# Patient Record
Sex: Male | Born: 1990
Health system: Southern US, Community
[De-identification: ages and names within clinical notes are randomized; demographics above are authoritative.]

## PROBLEM LIST (undated history)

## (undated) DIAGNOSIS — G4730 Sleep apnea, unspecified: Secondary | ICD-10-CM

## (undated) DIAGNOSIS — E119 Type 2 diabetes mellitus without complications: Secondary | ICD-10-CM

## (undated) DIAGNOSIS — F209 Schizophrenia, unspecified: Secondary | ICD-10-CM

## (undated) DIAGNOSIS — J45909 Unspecified asthma, uncomplicated: Secondary | ICD-10-CM

## (undated) HISTORY — DX: Unspecified asthma, uncomplicated: J45.909

---

## 2018-07-06 DIAGNOSIS — M549 Dorsalgia, unspecified: Secondary | ICD-10-CM | POA: Diagnosis not present

## 2018-07-06 DIAGNOSIS — Y92412 Parkway as the place of occurrence of the external cause: Secondary | ICD-10-CM | POA: Diagnosis not present

## 2018-07-06 DIAGNOSIS — S29012A Strain of muscle and tendon of back wall of thorax, initial encounter: Secondary | ICD-10-CM | POA: Diagnosis not present

## 2018-07-06 DIAGNOSIS — M546 Pain in thoracic spine: Secondary | ICD-10-CM | POA: Diagnosis not present

## 2018-07-06 DIAGNOSIS — Y999 Unspecified external cause status: Secondary | ICD-10-CM | POA: Diagnosis not present

## 2018-07-06 DIAGNOSIS — F209 Schizophrenia, unspecified: Secondary | ICD-10-CM | POA: Diagnosis not present

## 2018-12-31 DIAGNOSIS — G510 Bell's palsy: Secondary | ICD-10-CM | POA: Diagnosis not present

## 2019-02-02 DIAGNOSIS — J45909 Unspecified asthma, uncomplicated: Secondary | ICD-10-CM | POA: Diagnosis not present

## 2019-02-02 DIAGNOSIS — Z9114 Patient's other noncompliance with medication regimen: Secondary | ICD-10-CM | POA: Diagnosis not present

## 2019-02-02 DIAGNOSIS — Z20828 Contact with and (suspected) exposure to other viral communicable diseases: Secondary | ICD-10-CM | POA: Diagnosis not present

## 2019-02-02 DIAGNOSIS — F202 Catatonic schizophrenia: Secondary | ICD-10-CM | POA: Diagnosis not present

## 2019-02-10 DIAGNOSIS — F202 Catatonic schizophrenia: Secondary | ICD-10-CM | POA: Diagnosis not present

## 2019-02-28 DIAGNOSIS — Y906 Blood alcohol level of 120-199 mg/100 ml: Secondary | ICD-10-CM | POA: Diagnosis not present

## 2019-02-28 DIAGNOSIS — F1010 Alcohol abuse, uncomplicated: Secondary | ICD-10-CM | POA: Diagnosis not present

## 2019-02-28 DIAGNOSIS — F200 Paranoid schizophrenia: Secondary | ICD-10-CM | POA: Diagnosis not present

## 2019-02-28 DIAGNOSIS — F10120 Alcohol abuse with intoxication, uncomplicated: Secondary | ICD-10-CM | POA: Diagnosis not present

## 2019-02-28 DIAGNOSIS — Z79899 Other long term (current) drug therapy: Secondary | ICD-10-CM | POA: Diagnosis not present

## 2019-04-09 DIAGNOSIS — F209 Schizophrenia, unspecified: Secondary | ICD-10-CM | POA: Diagnosis not present

## 2019-06-10 DIAGNOSIS — F209 Schizophrenia, unspecified: Secondary | ICD-10-CM | POA: Diagnosis not present

## 2019-12-13 DIAGNOSIS — Z87891 Personal history of nicotine dependence: Secondary | ICD-10-CM | POA: Diagnosis not present

## 2019-12-13 DIAGNOSIS — Z20822 Contact with and (suspected) exposure to covid-19: Secondary | ICD-10-CM | POA: Diagnosis not present

## 2019-12-13 DIAGNOSIS — Z008 Encounter for other general examination: Secondary | ICD-10-CM | POA: Diagnosis not present

## 2019-12-13 DIAGNOSIS — F202 Catatonic schizophrenia: Secondary | ICD-10-CM | POA: Diagnosis not present

## 2019-12-13 DIAGNOSIS — Z720 Tobacco use: Secondary | ICD-10-CM | POA: Diagnosis not present

## 2019-12-13 DIAGNOSIS — F209 Schizophrenia, unspecified: Secondary | ICD-10-CM | POA: Diagnosis not present

## 2020-02-03 DIAGNOSIS — R45851 Suicidal ideations: Secondary | ICD-10-CM | POA: Diagnosis not present

## 2020-02-03 DIAGNOSIS — J45909 Unspecified asthma, uncomplicated: Secondary | ICD-10-CM | POA: Diagnosis not present

## 2020-02-03 DIAGNOSIS — F202 Catatonic schizophrenia: Secondary | ICD-10-CM | POA: Diagnosis not present

## 2020-02-03 DIAGNOSIS — Z20822 Contact with and (suspected) exposure to covid-19: Secondary | ICD-10-CM | POA: Diagnosis not present

## 2020-02-03 DIAGNOSIS — F329 Major depressive disorder, single episode, unspecified: Secondary | ICD-10-CM | POA: Diagnosis not present

## 2020-02-03 DIAGNOSIS — Z87891 Personal history of nicotine dependence: Secondary | ICD-10-CM | POA: Diagnosis not present

## 2020-02-04 DIAGNOSIS — F332 Major depressive disorder, recurrent severe without psychotic features: Secondary | ICD-10-CM | POA: Diagnosis not present

## 2020-02-04 DIAGNOSIS — R45851 Suicidal ideations: Secondary | ICD-10-CM | POA: Diagnosis not present

## 2020-02-04 DIAGNOSIS — Z20822 Contact with and (suspected) exposure to covid-19: Secondary | ICD-10-CM | POA: Diagnosis not present

## 2020-02-04 DIAGNOSIS — J45909 Unspecified asthma, uncomplicated: Secondary | ICD-10-CM | POA: Diagnosis not present

## 2020-02-04 DIAGNOSIS — F061 Catatonic disorder due to known physiological condition: Secondary | ICD-10-CM | POA: Diagnosis not present

## 2020-02-04 DIAGNOSIS — F329 Major depressive disorder, single episode, unspecified: Secondary | ICD-10-CM | POA: Diagnosis not present

## 2020-02-04 DIAGNOSIS — Z9114 Patient's other noncompliance with medication regimen: Secondary | ICD-10-CM | POA: Diagnosis not present

## 2020-02-04 DIAGNOSIS — F202 Catatonic schizophrenia: Secondary | ICD-10-CM | POA: Diagnosis not present

## 2020-02-04 DIAGNOSIS — F250 Schizoaffective disorder, bipolar type: Secondary | ICD-10-CM | POA: Diagnosis not present

## 2020-02-04 DIAGNOSIS — F209 Schizophrenia, unspecified: Secondary | ICD-10-CM | POA: Diagnosis not present

## 2020-02-04 DIAGNOSIS — Z87891 Personal history of nicotine dependence: Secondary | ICD-10-CM | POA: Diagnosis not present

## 2020-02-18 DIAGNOSIS — F209 Schizophrenia, unspecified: Secondary | ICD-10-CM | POA: Diagnosis not present

## 2020-02-23 DIAGNOSIS — Z20822 Contact with and (suspected) exposure to covid-19: Secondary | ICD-10-CM | POA: Insufficient documentation

## 2020-02-23 DIAGNOSIS — F209 Schizophrenia, unspecified: Secondary | ICD-10-CM | POA: Diagnosis not present

## 2020-02-23 DIAGNOSIS — F320 Major depressive disorder, single episode, mild: Secondary | ICD-10-CM | POA: Insufficient documentation

## 2020-02-23 DIAGNOSIS — Z03818 Encounter for observation for suspected exposure to other biological agents ruled out: Secondary | ICD-10-CM | POA: Diagnosis not present

## 2020-02-24 ENCOUNTER — Emergency Department (HOSPITAL_COMMUNITY)
Admission: EM | Admit: 2020-02-24 | Discharge: 2020-02-24 | Disposition: A | Discharge status: Home / Self Care | Payer: BC Managed Care – PPO | Attending: Emergency Medicine | Admitting: Emergency Medicine

## 2020-02-24 DIAGNOSIS — F209 Schizophrenia, unspecified: Secondary | ICD-10-CM

## 2020-02-24 LAB — SARS CORONAVIRUS 2 BY RT PCR (HOSPITAL ORDER, PERFORMED IN ~~LOC~~ HOSPITAL LAB): SARS Coronavirus 2: NEGATIVE

## 2020-02-24 LAB — COMPREHENSIVE METABOLIC PANEL
ALT: 67 U/L — ABNORMAL HIGH (ref 0–44)
AST: 29 U/L (ref 15–41)
Albumin: 4.8 g/dL (ref 3.5–5.0)
Alkaline Phosphatase: 37 U/L — ABNORMAL LOW (ref 38–126)
Anion gap: 12 (ref 5–15)
BUN: 10 mg/dL (ref 6–20)
CO2: 27 mmol/L (ref 22–32)
Calcium: 9.8 mg/dL (ref 8.9–10.3)
Chloride: 101 mmol/L (ref 98–111)
Creatinine, Ser: 0.95 mg/dL (ref 0.61–1.24)
GFR calc Af Amer: >60 mL/min (ref >60)
GFR calc non Af Amer: >60 mL/min (ref >60)
Glucose, Bld: 105 mg/dL — ABNORMAL HIGH (ref 70–99)
Potassium: 4.1 mmol/L (ref 3.5–5.1)
Sodium: 140 mmol/L (ref 135–145)
Total Bilirubin: 0.6 mg/dL (ref 0.3–1.2)
Total Protein: 8.2 g/dL — ABNORMAL HIGH (ref 6.5–8.1)

## 2020-02-24 LAB — CBC
HCT: 46.1 % (ref 39.0–52.0)
Hemoglobin: 14.9 g/dL (ref 13.0–17.0)
MCH: 27.9 pg (ref 26.0–34.0)
MCHC: 32.3 g/dL (ref 30.0–36.0)
MCV: 86.3 fL (ref 80.0–100.0)
Platelets: 274 10*3/uL (ref 150–400)
RBC: 5.34 MIL/uL (ref 4.22–5.81)
RDW: 14.7 % (ref 11.5–15.5)
WBC: 8 10*3/uL (ref 4.0–10.5)
nRBC: 0 % (ref 0.0–0.2)

## 2020-02-24 LAB — ETHANOL: Alcohol, Ethyl (B): <10 mg/dL (ref <10)

## 2020-02-24 MED ORDER — LORAZEPAM 1 MG PO TABS: 1.0000 mg | ORAL_TABLET | ORAL | Status: DC | PRN

## 2020-02-24 MED ORDER — ZIPRASIDONE MESYLATE 20 MG IM SOLR: 20.0000 mg | INTRAMUSCULAR | Status: DC | PRN

## 2020-02-24 MED ORDER — OLANZAPINE 10 MG PO TBDP: 10.0000 mg | ORAL_TABLET | Freq: Three times a day (TID) | ORAL | Status: DC | PRN

## 2020-02-24 NOTE — ED Notes (Signed)
Provided pt w/labeled specimen cup for urine collection. ENMiles 

## 2020-02-24 NOTE — BH Assessment (Signed)
BHH Assessment Progress Note  Per Nelly Rout, MD, this pt does not require psychiatric hospitalization at this time.  Pt is to be discharged from [facility] with outpatient referrals.  Pt would prefer to have appointments scheduled.  Pt now has appointments scheduled at the [facility] [place].  Pt will see Hilbert Odor, LCSW for intake on 02/24/2020 at 14:30.  She will see Kathryne Sharper, MD for psychiatry on 04/28/2020 at 11:00.  These appointments have been included in pt's discharge instructions.  Pt's nurse, Yvonna Alanis, has been notified.  Doylene Canning, MA Triage Specialist [phone]

## 2020-02-24 NOTE — ED Triage Notes (Signed)
Arrived POV. Patient will not answer any questions during triage. Patient has blank stare, Will try to contact family

## 2020-02-24 NOTE — BH Assessment (Addendum)
Assessment Note  Nathan Wilkerson is an 29 y.o. male presenting voluntarily to [facility] ED via POV. Per EDP: "Patient with history of schizophrenia was apparently dropped off here in the emergency department.  He is completely nonverbal, will not answer any questions.  He appears well otherwise.  He is awake, alert with no focal neurologic deficits or findings.  Suspect behavior is psychiatric, will require behavioral health evaluation."  Upon this counselor's exam patient continues to be nonverbal.  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 Patient has a history of schizophrenia and was recently hospitalized at [facility] for 2 weeks. Patient had same presentation prior to hospitalization. When he came home he was "totally normal." Several days ago he became nonverbal and appeared anxious again. To his knowledge patient is not suicidal or homicidal.   Diagnosis: Schizophrenia  Past Medical History: No past medical history on file.  Family History: No family history on file.  Social History:  has no history on file for tobacco, alcohol, and drug.  Additional Social History:  Alcohol / Drug Use Pain Medications: see MAR Prescriptions: see MAR Over the Counter: see MAR History of alcohol / drug use?: No history of alcohol / drug abuse  CIWA: CIWA-Ar BP: (!) 110/94 Pulse Rate: 80 COWS:    Allergies: Not on File  Home Medications: (Not in a hospital admission)   OB/GYN Status:  No LMP for male patient.  General Assessment Data Location of Assessment: [facility] ED TTS Assessment: In system Is this a Tele or Face-to-Face Assessment?: Face-to-Face Is this an Initial Assessment or a Re-assessment for this encounter?: Initial Assessment Patient Accompanied by:: N/A Language Other than English: No Living Arrangements: (with uncle) What gender do you identify as?:  Male Date Telepsych consult ordered in [facility]: 02/24/20 Time Telepsych consult ordered in [facility]: 0129 Marital status: [place] name: Auzenne Pregnancy Status: No Living Arrangements: Other relatives Can pt return to current living arrangement?: Yes Admission Status: Voluntary Is patient capable of signing voluntary admission?: Yes Referral Source: Self/Family/Friend Insurance type: [place] Living Arrangements: Other relatives Legal Guardian: (self) Name of Psychiatrist: [facility] Name of Therapist: [facility]  Education Status Is patient currently in school?: No Is the patient employed, unemployed or receiving disability?: Employed  Risk to self with the past 6 months Suicidal Ideation: No Has patient been a risk to self within the past 6 months prior to admission? : No Suicidal Intent: No Has patient had any suicidal intent within the past 6 months prior to admission? : No Is patient at risk for suicide?: No Suicidal Plan?: No Has patient had any suicidal plan within the past 6 months prior to admission? : No Access to Means: No What has been your use of drugs/alcohol within the last 12 months?: UTA Previous Attempts/Gestures: (UTA) How many times?: (UTA) Other Self Harm Risks: none noted Triggers for Past Attempts: Unknown Intentional Self Injurious Behavior: (UTA) Family Suicide History: Unable to assess Recent stressful life event(s): (UTA) Persecutory voices/beliefs?: No Depression: (UTA) Depression Symptoms: (UTA) Substance abuse history and/or treatment for substance abuse?: No Suicide prevention information given to non-admitted patients: Not applicable  Risk to Others within the past 6 months Homicidal Ideation: No Does patient have any lifetime risk of violence toward others beyond the six months prior to admission? : No Thoughts of Harm to Others: No Current Homicidal Intent:  No Current Homicidal Plan: No Access to Homicidal Means:  No Identified Victim: UTA History of harm to others?: No Assessment of Violence: None Noted Violent Behavior Description: none noted Does patient have access to weapons?: No Criminal Charges Pending?: No Does patient have a court date: No Is patient on probation?: No  Psychosis Hallucinations: (UTA) Delusions: Persecutory  Mental Status Report Appearance/Hygiene: In scrubs Eye Contact: Poor Motor Activity: Freedom of movement Speech: Aphasic Level of Consciousness: Quiet/awake Mood: (UTA) Affect: Flat Anxiety Level: None Thought Processes: Unable to Assess Judgement: Unable to Assess Orientation: Unable to assess Obsessive Compulsive Thoughts/Behaviors: Unable to Assess  Cognitive Functioning Concentration: Unable to Assess Memory: Unable to Assess Is patient IDD: No Insight: Unable to Assess Impulse Control: Unable to Assess Appetite: (UTA) Have you had any weight changes? : (UAT) Sleep: Unable to Assess Total Hours of Sleep: (UTA) Vegetative Symptoms: Unable to Assess  ADLScreening [facility] Assessment Services) Patient's cognitive ability adequate to safely complete daily activities?: Yes Patient able to express need for assistance with ADLs?: Yes Independently performs ADLs?: Yes (appropriate for developmental age)  Prior Inpatient Therapy Prior Inpatient Therapy: Yes Prior Therapy Dates: 2021 Prior Therapy Facilty/Provider(s): [facility] Reason for Treatment: schizophrenia  Prior Outpatient Therapy Prior Outpatient Therapy: Yes Prior Therapy Dates: ongoing Prior Therapy Facilty/Provider(s): [facility] Reason for Treatment: med mangement, therapy Does patient have an ACCT team?: No Does patient have Intensive In-House Services?  : No Does patient have [facility] services? : No Does patient have P4CC services?: No  ADL Screening (condition at time of admission) Patient's cognitive ability adequate to safely complete daily activities?: Yes Is the patient deaf  or have difficulty hearing?: No Does the patient have difficulty seeing, even when wearing glasses/contacts?: No Does the patient have difficulty concentrating, remembering, or making decisions?: No Patient able to express need for assistance with ADLs?: Yes Does the patient have difficulty dressing or bathing?: No Independently performs ADLs?: Yes (appropriate for developmental age) Does the patient have difficulty walking or climbing stairs?: No Weakness of Legs: None Weakness of Arms/Hands: None  Home Assistive Devices/Equipment Home Assistive Devices/Equipment: None  Therapy Consults (therapy consults require a physician order) PT Evaluation Needed: No OT Evalulation Needed: No SLP Evaluation Needed: No Abuse/Neglect Assessment (Assessment to be complete while patient is alone) Abuse/Neglect Assessment Can Be Completed: Unable to assess, patient is non-responsive or altered mental status Values / Beliefs Cultural Requests During Hospitalization: None Spiritual Requests During Hospitalization: None Consults Spiritual Care Consult Needed: No Transition of Care Team Consult Needed: No Advance Directives (For Healthcare) Does Patient Have a Medical Advance Directive?: Unable to assess, patient is non-responsive or altered mental status          Disposition: Per Dr. Lucianne Muss and Ophelia Shoulder, PMHNP patient does not meet in patient criteria and is psych cleared.  Disposition Initial Assessment Completed for this Encounter: Yes  On Site Evaluation by:   Reviewed with Physician:    Celedonio Miyamoto 02/24/2020 10:14 AM

## 2020-02-24 NOTE — ED Provider Notes (Addendum)
[facility]-EMERGENCY DEPT Provider Note   CSN: [id number] Arrival date & time: 02/23/20  2338     History No chief complaint on file.   Nathan Wilkerson is a 29 y.o. male.  Patient presents in the emergency department for unclear reasons.  He will not answer any questions.  Patient just stares at you asked questions and does not speak. Level V Caveat due to nonverbal patient.        No past medical history on file.  There are no problems to display for this patient.        No family history on file.  Social History   Tobacco Use  . Smoking status: Not on file  Substance Use Topics  . Alcohol use: Not on file  . Drug use: Not on file    Home Medications Prior to Admission medications   Not on File    Allergies    Patient has no allergy information on record.  Review of Systems   Review of Systems  Unable to perform ROS: Patient nonverbal    Physical Exam Updated Vital Signs BP (!) 110/94 (BP Location: Left Arm)   Pulse 80   SpO2 99%   Physical Exam Vitals and nursing note reviewed.  HENT:     Head: Atraumatic.  Eyes:     Pupils: Pupils are equal, round, and reactive to light.  Cardiovascular:     Rate and Rhythm: Normal rate.     Heart sounds: Normal heart sounds.  Pulmonary:     Effort: Pulmonary effort is normal.     Breath sounds: Normal breath sounds.  Musculoskeletal:        General: Normal range of motion.  Skin:    General: Skin is warm and dry.  Neurological:     General: No focal deficit present.     Mental Status: He is alert.  Psychiatric:        Attention and Perception: He is inattentive.        Mood and Affect: Affect is flat.        Speech: He is noncommunicative.        Behavior: Behavior is withdrawn.     ED Results / Procedures / Treatments   Labs (all labs ordered are listed, but only abnormal results are displayed) Labs Reviewed - No data to display  EKG None  Radiology No results  found.  Procedures Procedures (including critical care time)  Medications Ordered in ED Medications - No data to display  ED Course  I have reviewed the triage vital signs and the nursing notes.  Pertinent labs & imaging results that were available during my care of the patient were reviewed by me and considered in my medical decision making (see chart for details).    MDM Rules/Calculators/A&P                      Patient with history of schizophrenia was apparently dropped off here in the emergency department.  He is completely nonverbal, will not answer any questions.  He appears well otherwise.  He is awake, alert with no focal neurologic deficits or findings.  Suspect behavior is psychiatric, will require behavioral health evaluation.  Addendum: Additional information provided by patient's mother and uncle.  Patient has long history of schizophrenia.  He was just released from [facility].  He had a 14-day stay there and had medications changed during his stay.  Uncle  reports that he was doing well at time of discharge but this morning he noticed that he was no longer interacting.  This is the same type of behavior that he has exhibited with his psychiatric illness in the past.  Final Clinical Impression(s) / ED Diagnoses Final diagnoses:  Schizophrenia, unspecified type [facility])    Rx / DC Orders ED Discharge Orders    None       Gabriell Casimir, Gwenyth Allegra, MD 02/24/20 0050    Orpah Greek, MD 02/24/20 0127

## 2020-02-24 NOTE — ED Notes (Signed)
Pt's uncle, Teressa Senter [phone]), would like to be contacted with updates.

## 2020-02-24 NOTE — ED Notes (Signed)
RN asked patient if he would like to be set up with an outpatient appointment. Patient hesitant but nodded yes. RN explained the importance of keeping the apt as he could be billed for a no-show. Patient nodded again.

## 2020-02-24 NOTE — ED Notes (Signed)
Spoke with patient's uncle Orlie Pollen. Nathan Wilkerson reports when patient was released from [facility] he was good, he was talking and interacting. At 8 AM before uncle left for work he reports he noticed the patient wasn't responding and back in a shell. Uncle reports he got home around 1630 from work and patient was the same. Uncle is unsure if patient is taking his prescribed medications like he should because he is new to taking care of him, but is willing to help any way he can.

## 2020-02-24 NOTE — ED Notes (Signed)
Rn attempted to see if patient's emergency contacts could pick patient up. Patient's uncle is at work and mother not answering at this time. RN asked patient's uncle if patient has a key to the house and uncle stated no.

## 2020-02-24 NOTE — BHH Counselor (Addendum)
Disposition: Per Dr. Lucianne Muss and Ophelia Shoulder, PMHNP patient does not meet in patient criteria and is psych cleared. This counselor called patient's uncle to update on disposition but he did not answer and mail box is full.

## 2020-02-24 NOTE — ED Notes (Signed)
Pt refused temp 

## 2020-02-24 NOTE — Discharge Instructions (Signed)
For your behavioral health needs, you are advised to follow up with [facility]. You are scheduled for the following appointments:  THERAPY:      Hilbert Odor, LCSW      Tuesday, February 29, 2020 at 2:30 pm  PSYCHIATRY:      Kathryne Sharper, MD      Friday, April 28, 2020 at 11:00 am       [facility]      [address]      [place], [place] [address]      [phone]

## 2020-02-25 DIAGNOSIS — F209 Schizophrenia, unspecified: Secondary | ICD-10-CM | POA: Diagnosis not present

## 2020-02-29 ENCOUNTER — Other Ambulatory Visit: Payer: Self-pay

## 2020-02-29 ENCOUNTER — Ambulatory Visit (HOSPITAL_COMMUNITY): Payer: Self-pay | Admitting: Psychiatry

## 2020-03-01 ENCOUNTER — Encounter (HOSPITAL_COMMUNITY): Payer: Self-pay | Admitting: Psychiatry

## 2020-03-01 ENCOUNTER — Ambulatory Visit (INDEPENDENT_AMBULATORY_CARE_PROVIDER_SITE_OTHER): Payer: BC Managed Care – PPO | Admitting: Psychiatry

## 2020-03-01 ENCOUNTER — Other Ambulatory Visit: Payer: Self-pay

## 2020-03-01 DIAGNOSIS — F4310 Post-traumatic stress disorder, unspecified: Secondary | ICD-10-CM

## 2020-03-01 DIAGNOSIS — F200 Paranoid schizophrenia: Secondary | ICD-10-CM | POA: Diagnosis not present

## 2020-03-01 NOTE — Progress Notes (Signed)
Virtual Visit via Video Note  I connected with Nathan Wilkerson on 03/01/20 at  2:30 PM EDT by a video enabled telemedicine application and verified that I am speaking with the correct person using two identifiers.  Location: Patient: Patient Home Provider: Home Office   I discussed the limitations of evaluation and management by telemedicine and the availability of in person appointments. The patient expressed understanding and agreed to proceed.  History of Present Illness: PTSD and Schizophrenia, paranoid type   Treatment Plan Goals: 1) Nathan Wilkerson would like to better understand his mental health diagnosis and how it is impacting his functioning, ie slowed response/thought processes, isolation, paranoia and sadness. 2) Nathan Wilkerson would like to improve social skills and interactions with others, through exploring his fear of rejection and trust issues. 3) Nathan Wilkerson would like to better understand his grief responses/triggers to stabilize mood and heal from losses.   Observations/Objective: Counselor met with Client for individual therapy via Webex. Counselor assessed MH symptoms and created treatment plan goals, with patient presenting as paranoid, anxious, suspicious, slow to respond and flat affect. Client presents with severe depression and high anxiety. Client denied recent suicidal ideation or self-harm behaviors.   Counselor attempted to engage Client in the joining process, but Client noted feelings of discomfort and being slow to trust/warm up to people. Client was hesitant to respond to questions. Counselor allowed time for Client to ask questions of Counselor to ease the tension. Client began opening up more after Counselor's response. Counselor discussed comprehensive clinical assessment and paperwork needs. Client was unsure about giving thorough responses. Counselor and Client worked to develop treatment plan goals (see above). Client noted that he would start keeping a journal and would consider  getting a plant or pet to have something to engage with when off work. Client expressed interest in [facility]. Counselor to send links for contact. Client to schedule future appointments at his comfort and availability.   Assessment and Plan: Counselor will continue to meet with patient to address treatment plan goals. Patient will continue to follow recommendations of providers and implement skills learned in session.  Follow Up Instructions: Counselor will send information for next session via Webex.    The patient was advised to call back or seek an in-person evaluation if the symptoms worsen or if the condition fails to improve as anticipated.  I provided 55 minutes of non-face-to-face time during this encounter.    , LCSW  

## 2020-03-08 ENCOUNTER — Encounter (HOSPITAL_COMMUNITY): Payer: Self-pay | Admitting: Psychiatry

## 2020-03-16 DIAGNOSIS — F209 Schizophrenia, unspecified: Secondary | ICD-10-CM | POA: Diagnosis not present

## 2020-03-30 ENCOUNTER — Ambulatory Visit: Payer: Self-pay | Admitting: Emergency Medicine

## 2020-03-31 ENCOUNTER — Encounter: Payer: Self-pay | Admitting: Emergency Medicine

## 2020-04-10 DIAGNOSIS — F209 Schizophrenia, unspecified: Secondary | ICD-10-CM | POA: Diagnosis not present

## 2020-04-12 ENCOUNTER — Encounter: Payer: Self-pay | Admitting: Emergency Medicine

## 2020-04-12 ENCOUNTER — Other Ambulatory Visit: Payer: Self-pay

## 2020-04-12 ENCOUNTER — Ambulatory Visit: Payer: BC Managed Care – PPO | Admitting: Emergency Medicine

## 2020-04-12 VITALS — BP 118/83 | HR 70 | Temp 98.1°F | Resp 16 | Ht 67.0 in | Wt 276.0 lb

## 2020-04-12 DIAGNOSIS — Z833 Family history of diabetes mellitus: Secondary | ICD-10-CM | POA: Diagnosis not present

## 2020-04-12 DIAGNOSIS — Z130 Encounter for screening for diseases of the blood and blood-forming organs and certain disorders involving the immune mechanism: Secondary | ICD-10-CM

## 2020-04-12 DIAGNOSIS — Z8659 Personal history of other mental and behavioral disorders: Secondary | ICD-10-CM

## 2020-04-12 DIAGNOSIS — Z23 Encounter for immunization: Secondary | ICD-10-CM | POA: Diagnosis not present

## 2020-04-12 DIAGNOSIS — Z131 Encounter for screening for diabetes mellitus: Secondary | ICD-10-CM | POA: Diagnosis not present

## 2020-04-12 DIAGNOSIS — Z7689 Persons encountering health services in other specified circumstances: Secondary | ICD-10-CM

## 2020-04-12 DIAGNOSIS — Z1329 Encounter for screening for other suspected endocrine disorder: Secondary | ICD-10-CM

## 2020-04-12 DIAGNOSIS — Z13220 Encounter for screening for lipoid disorders: Secondary | ICD-10-CM | POA: Diagnosis not present

## 2020-04-12 DIAGNOSIS — Z1321 Encounter for screening for nutritional disorder: Secondary | ICD-10-CM

## 2020-04-12 DIAGNOSIS — Z13228 Encounter for screening for other metabolic disorders: Secondary | ICD-10-CM

## 2020-04-12 LAB — POCT GLYCOSYLATED HEMOGLOBIN (HGB A1C): Hemoglobin A1C: 6 % — AB (ref 4.0–5.6)

## 2020-04-12 LAB — GLUCOSE, POCT (MANUAL RESULT ENTRY): POC Glucose: 81 mg/dl (ref 70–99)

## 2020-04-12 NOTE — Patient Instructions (Addendum)
   If you have lab work done today you will be contacted with your lab results within the next 2 weeks.  If you have not heard from us then please contact us. The fastest way to get your results is to register for My Chart.   IF you received an x-ray today, you will receive an invoice from [facility]. Please contact [facility] at [phone] with questions or concerns regarding your invoice.   IF you received labwork today, you will receive an invoice from [facility]. Please contact [facility] at [phone] with questions or concerns regarding your invoice.   Our billing staff will not be able to assist you with questions regarding bills from these companies.  You will be contacted with the lab results as soon as they are available. The fastest way to get your results is to activate your My Chart account. Instructions are located on the last page of this paperwork. If you have not heard from us regarding the results in 2 weeks, please contact this office.      Health Maintenance, Male Adopting a healthy lifestyle and getting preventive care are important in promoting health and wellness. Ask your health care provider about:  The right schedule for you to have regular tests and exams.  Things you can do on your own to prevent diseases and keep yourself healthy. What should I know about diet, weight, and exercise? Eat a healthy diet   Eat a diet that includes plenty of vegetables, fruits, low-fat dairy products, and lean protein.  Do not eat a lot of foods that are high in solid fats, added sugars, or sodium. Maintain a healthy weight Body mass index (BMI) is a measurement that can be used to identify possible weight problems. It estimates body fat based on height and weight. Your health care provider can help determine your BMI and help you achieve or maintain a healthy weight. Get regular exercise Get regular exercise. This is one of the most important things you  can do for your health. Most adults should:  Exercise for at least 150 minutes each week. The exercise should increase your heart rate and make you sweat (moderate-intensity exercise).  Do strengthening exercises at least twice a week. This is in addition to the moderate-intensity exercise.  Spend less time sitting. Even light physical activity can be beneficial. Watch cholesterol and blood lipids Have your blood tested for lipids and cholesterol at 28 years of age, then have this test every 5 years. You may need to have your cholesterol levels checked more often if:  Your lipid or cholesterol levels are high.  You are older than 28 years of age.  You are at high risk for heart disease. What should I know about cancer screening? Many types of cancers can be detected early and may often be prevented. Depending on your health history and family history, you may need to have cancer screening at various ages. This may include screening for:  Colorectal cancer.  Prostate cancer.  Skin cancer.  Lung cancer. What should I know about heart disease, diabetes, and high blood pressure? Blood pressure and heart disease  High blood pressure causes heart disease and increases the risk of stroke. This is more likely to develop in people who have high blood pressure readings, are of African descent, or are overweight.  Talk with your health care provider about your target blood pressure readings.  Have your blood pressure checked: ? Every 3-5 years if you are 18-39   years of age. ? Every year if you are 40 years old or older.  If you are between the ages of 65 and 75 and are a current or former smoker, ask your health care provider if you should have a one-time screening for abdominal aortic aneurysm (AAA). Diabetes Have regular diabetes screenings. This checks your fasting blood sugar level. Have the screening done:  Once every three years after age 45 if you are at a normal weight and have  a low risk for diabetes.  More often and at a younger age if you are overweight or have a high risk for diabetes. What should I know about preventing infection? Hepatitis B If you have a higher risk for hepatitis B, you should be screened for this virus. Talk with your health care provider to find out if you are at risk for hepatitis B infection. Hepatitis C Blood testing is recommended for:  Everyone born from 1945 through 1965.  Anyone with known risk factors for hepatitis C. Sexually transmitted infections (STIs)  You should be screened each year for STIs, including gonorrhea and chlamydia, if: ? You are sexually active and are younger than 28 years of age. ? You are older than 28 years of age and your health care provider tells you that you are at risk for this type of infection. ? Your sexual activity has changed since you were last screened, and you are at increased risk for chlamydia or gonorrhea. Ask your health care provider if you are at risk.  Ask your health care provider about whether you are at high risk for HIV. Your health care provider may recommend a prescription medicine to help prevent HIV infection. If you choose to take medicine to prevent HIV, you should first get tested for HIV. You should then be tested every 3 months for as long as you are taking the medicine. Follow these instructions at home: Lifestyle  Do not use any products that contain nicotine or tobacco, such as cigarettes, e-cigarettes, and chewing tobacco. If you need help quitting, ask your health care provider.  Do not use street drugs.  Do not share needles.  Ask your health care provider for help if you need support or information about quitting drugs. Alcohol use  Do not drink alcohol if your health care provider tells you not to drink.  If you drink alcohol: ? Limit how much you have to 0-2 drinks a day. ? Be aware of how much alcohol is in your drink. In the [place] one drink equals one 12  oz bottle of beer (355 mL), one 5 oz glass of wine (148 mL), or one 1 oz glass of hard liquor (44 mL). General instructions  Schedule regular health, dental, and eye exams.  Stay current with your vaccines.  Tell your health care provider if: ? You often feel depressed. ? You have ever been abused or do not feel safe at home. Summary  Adopting a healthy lifestyle and getting preventive care are important in promoting health and wellness.  Follow your health care provider's instructions about healthy diet, exercising, and getting tested or screened for diseases.  Follow your health care provider's instructions on monitoring your cholesterol and blood pressure. This information is not intended to replace advice given to you by your health care provider. Make sure you discuss any questions you have with your health care provider. Document Revised: 09/02/2018 Document Reviewed: 09/02/2018 Elsevier Patient Education  2020 [employer]  

## 2020-04-12 NOTE — Progress Notes (Signed)
Nathan Wilkerson 29 y.o.   Chief Complaint  Patient presents with  . Establish Care    per pt Dx with Schizophrenia  . family history diabetes    per pt Grandmothers and wants to be checked    HISTORY OF PRESENT ILLNESS: This is a 29 y.o. male with history of schizophrenia diagnosed at age 72 unknown medications since 81.  Sees Nathan Rubens, NP associated with psychiatric group.  Presently on Abilify and Remeron.  Recently moved to this county. Strong family history of diabetes.  Wants to be checked for diabetes. No other significant past medical history. No other complaints or medical concerns today. Here to establish care with me.  HPI   Prior to Admission medications   Medication Sig Start Date End Date Taking? Authorizing Provider  ARIPiprazole (ABILIFY) 20 MG tablet Take 20 mg by mouth daily.   Yes [provider]  mirtazapine (REMERON) 15 MG tablet Take 15 mg by mouth at bedtime.   Yes [provider]  OVER THE COUNTER MEDICATION    Yes [provider]    Allergies  Allergen Reactions  . Amoxicillin     There are no problems to display for this patient.   Past Medical History:  Diagnosis Date  . Asthma    Phreesia 03/27/2020    History reviewed. No pertinent surgical history.  Social History   Socioeconomic History  . Marital status: Single    Spouse name: Not on file  . Number of children: Not on file  . Years of education: Not on file  . Highest education level: Not on file  Occupational History  . Not on file  Tobacco Use  . Smoking status: Unknown If Ever Smoked  . Smokeless tobacco: Never Used  Substance and Sexual Activity  . Alcohol use: Not Currently  . Drug use: Not Currently  . Sexual activity: Not Currently  Other Topics Concern  . Not on file  Social History Narrative  . Not on file   Social Determinants of Health   Financial Resource Strain:   . Difficulty of Paying Living Expenses:   Food Insecurity:     . Worried About Programme researcher, broadcasting/film/video in the Last Year:   . Barista in the Last Year:   Transportation Needs:   . Freight forwarder (Medical):   Marland Kitchen Lack of Transportation (Non-Medical):   Physical Activity:   . Days of Exercise per Week:   . Minutes of Exercise per Session:   Stress:   . Feeling of Stress :   Social Connections:   . Frequency of Communication with Friends and Family:   . Frequency of Social Gatherings with Friends and Family:   . Attends Religious Services:   . Active Member of Clubs or Organizations:   . Attends Banker Meetings:   Marland Kitchen Marital Status:   Intimate Partner Violence:   . Fear of Current or Ex-Partner:   . Emotionally Abused:   Marland Kitchen Physically Abused:   . Sexually Abused:     History reviewed. No pertinent family history.   Review of Systems  Constitutional: Negative.  Negative for chills and fever.  HENT: Negative.  Negative for congestion and sore throat.   Respiratory: Negative.  Negative for cough and shortness of breath.   Cardiovascular: Negative.  Negative for chest pain and palpitations.  Gastrointestinal: Negative.  Negative for abdominal pain, diarrhea, nausea and vomiting.  Genitourinary: Negative.  Negative for dysuria and hematuria.  Musculoskeletal: Negative.  Negative for myalgias and neck pain.  Skin: Negative.  Negative for rash.  Neurological: Negative.  Negative for dizziness and headaches.  All other systems reviewed and are negative.  Today's Vitals   04/12/20 1539  BP: 118/83  Pulse: 70  Resp: 16  Temp: 98.1 F (36.7 C)  TempSrc: Temporal  SpO2: 97%  Weight: 276 lb (125.2 kg)  Height: 5\' 7"  (1.702 m)   Body mass index is 43.23 kg/m.   Physical Exam Vitals reviewed.  Constitutional:      Appearance: Normal appearance.  HENT:     Head: Normocephalic.     Mouth/Throat:     Mouth: Mucous membranes are moist.     Pharynx: Oropharynx is clear.  Eyes:     Extraocular Movements:  Extraocular movements intact.     Conjunctiva/sclera: Conjunctivae normal.     Pupils: Pupils are equal, round, and reactive to light.  Cardiovascular:     Rate and Rhythm: Normal rate and regular rhythm.     Pulses: Normal pulses.     Heart sounds: Normal heart sounds.  Pulmonary:     Effort: Pulmonary effort is normal.     Breath sounds: Normal breath sounds.  Abdominal:     Palpations: Abdomen is soft.     Tenderness: There is no abdominal tenderness.  Musculoskeletal:        General: Normal range of motion.     Cervical back: Normal range of motion and neck supple.  Skin:    General: Skin is warm and dry.     Capillary Refill: Capillary refill takes less than 2 seconds.  Neurological:     General: No focal deficit present.     Mental Status: He is alert and oriented to person, place, and time.  Psychiatric:        Mood and Affect: Mood normal.        Behavior: Behavior normal.      ASSESSMENT & PLAN: Nathan Wilkerson was seen today for establish care and family history diabetes.  Diagnoses and all orders for this visit:  Encounter to establish care  History of schizophrenia  Family history of diabetes mellitus  Screening for diabetes mellitus -     POCT glucose (manual entry) -     POCT glycosylated hemoglobin (Hb A1C)  Need for diphtheria-tetanus-pertussis (Tdap) vaccine -     Cancel: Tdap vaccine greater than or equal to 7yo IM  Screening for deficiency anemia -     CBC with Differential/Platelet  Screening for endocrine, nutritional, metabolic and immunity disorder -     Comprehensive metabolic panel  Screening for lipoid disorders -     Lipid panel    Patient Instructions       If you have lab work done today you will be contacted with your lab results within the next 2 weeks.  If you have not heard from Nathan Wilkerson then please contact us. The fastest way to get your results is to register for My Chart.   IF you received an x-ray today, you will receive an  invoice from [facility]. Please contact [facility] at [phone] with questions or concerns regarding your invoice.   IF you received labwork today, you will receive an invoice from [facility]. Please contact [facility] at [phone] with questions or concerns regarding your invoice.   Our billing staff will not be able to assist you with questions regarding bills from these companies.  You will be contacted with the lab results  as soon as they are available. The fastest way to get your results is to activate your My Chart account. Instructions are located on the last page of this paperwork. If you have not heard from [place] regarding the results in 2 weeks, please contact this office.     Health Maintenance, Male Adopting a healthy lifestyle and getting preventive care are important in promoting health and wellness. Ask your health care provider about:  The right schedule for you to have regular tests and exams.  Things you can do on your own to prevent diseases and keep yourself healthy. What should I know about diet, weight, and exercise? Eat a healthy diet   Eat a diet that includes plenty of vegetables, fruits, low-fat dairy products, and lean protein.  Do not eat a lot of foods that are high in solid fats, added sugars, or sodium. Maintain a healthy weight Body mass index (BMI) is a measurement that can be used to identify possible weight problems. It estimates body fat based on height and weight. Your health care provider can help determine your BMI and help you achieve or maintain a healthy weight. Get regular exercise Get regular exercise. This is one of the most important things you can do for your health. Most adults should:  Exercise for at least 150 minutes each week. The exercise should increase your heart rate and make you sweat (moderate-intensity exercise).  Do strengthening exercises at least twice a week. This is in addition to the moderate-intensity  exercise.  Spend less time sitting. Even light physical activity can be beneficial. Watch cholesterol and blood lipids Have your blood tested for lipids and cholesterol at 28 years of age, then have this test every 5 years. You may need to have your cholesterol levels checked more often if:  Your lipid or cholesterol levels are high.  You are older than 28 years of age.  You are at high risk for heart disease. What should I know about cancer screening? Many types of cancers can be detected early and may often be prevented. Depending on your health history and family history, you may need to have cancer screening at various ages. This may include screening for:  Colorectal cancer.  Prostate cancer.  Skin cancer.  Lung cancer. What should I know about heart disease, diabetes, and high blood pressure? Blood pressure and heart disease  High blood pressure causes heart disease and increases the risk of stroke. This is more likely to develop in people who have high blood pressure readings, are of African descent, or are overweight.  Talk with your health care provider about your target blood pressure readings.  Have your blood pressure checked: ? Every 3-5 years if you are 80-24 years of age. ? Every year if you are 54 years old or older.  If you are between the ages of 47 and 9 and are a current or former smoker, ask your health care provider if you should have a one-time screening for abdominal aortic aneurysm (AAA). Diabetes Have regular diabetes screenings. This checks your fasting blood sugar level. Have the screening done:  Once every three years after age 68 if you are at a normal weight and have a low risk for diabetes.  More often and at a younger age if you are overweight or have a high risk for diabetes. What should I know about preventing infection? Hepatitis B If you have a higher risk for hepatitis B, you should be screened for this virus. Talk  with your health care  provider to find out if you are at risk for hepatitis B infection. Hepatitis C Blood testing is recommended for:  Everyone born from [birth year] through 1964.  Anyone with known risk factors for hepatitis C. Sexually transmitted infections (STIs)  You should be screened each year for STIs, including gonorrhea and chlamydia, if: ? You are sexually active and are younger than 28 years of age. ? You are older than 27 years of age and your health care provider tells you that you are at risk for this type of infection. ? Your sexual activity has changed since you were last screened, and you are at increased risk for chlamydia or gonorrhea. Ask your health care provider if you are at risk.  Ask your health care provider about whether you are at high risk for HIV. Your health care provider may recommend a prescription medicine to help prevent HIV infection. If you choose to take medicine to prevent HIV, you should first get tested for HIV. You should then be tested every 3 months for as long as you are taking the medicine. Follow these instructions at home: Lifestyle  Do not use any products that contain nicotine or tobacco, such as cigarettes, e-cigarettes, and chewing tobacco. If you need help quitting, ask your health care provider.  Do not use street drugs.  Do not share needles.  Ask your health care provider for help if you need support or information about quitting drugs. Alcohol use  Do not drink alcohol if your health care provider tells you not to drink.  If you drink alcohol: ? Limit how much you have to 0-2 drinks a day. ? Be aware of how much alcohol is in your drink. In the [place] one drink equals one 12 oz bottle of beer (355 mL), one 5 oz glass of wine (148 mL), or one 1 oz glass of hard liquor (44 mL). General instructions  Schedule regular health, dental, and eye exams.  Stay current with your vaccines.  Tell your health care provider if: ? You often feel depressed. ? You  have ever been abused or do not feel safe at home. Summary  Adopting a healthy lifestyle and getting preventive care are important in promoting health and wellness.  Follow your health care provider's instructions about healthy diet, exercising, and getting tested or screened for diseases.  Follow your health care provider's instructions on monitoring your cholesterol and blood pressure. This information is not intended to replace advice given to you by your health care provider. Make sure you discuss any questions you have with your health care provider. Document Revised: 09/02/2018 Document Reviewed: 09/02/2018 Elsevier Patient Education  2020 Elsevier Inc.      Edwina Barth, MD [facility]

## 2020-04-13 LAB — COMPREHENSIVE METABOLIC PANEL
ALT: 51 IU/L — ABNORMAL HIGH (ref 0–44)
AST: 30 IU/L (ref 0–40)
Albumin/Globulin Ratio: 1.7 (ref 1.2–2.2)
Albumin: 4.7 g/dL (ref 4.1–5.2)
Alkaline Phosphatase: 59 IU/L (ref 48–121)
BUN/Creatinine Ratio: 9 (ref 9–20)
BUN: 9 mg/dL (ref 6–20)
Bilirubin Total: 0.3 mg/dL (ref 0.0–1.2)
CO2: 25 mmol/L (ref 20–29)
Calcium: 9.6 mg/dL (ref 8.7–10.2)
Chloride: 102 mmol/L (ref 96–106)
Creatinine, Ser: 0.97 mg/dL (ref 0.76–1.27)
GFR calc Af Amer: 123 mL/min/{1.73_m2} (ref >59)
GFR calc non Af Amer: 107 mL/min/{1.73_m2} (ref >59)
Globulin, Total: 2.8 g/dL (ref 1.5–4.5)
Glucose: 81 mg/dL (ref 65–99)
Potassium: 4.2 mmol/L (ref 3.5–5.2)
Sodium: 141 mmol/L (ref 134–144)
Total Protein: 7.5 g/dL (ref 6.0–8.5)

## 2020-04-13 LAB — CBC WITH DIFFERENTIAL/PLATELET
Basophils Absolute: 0.1 10*3/uL (ref 0.0–0.2)
Basos: 1 %
EOS (ABSOLUTE): 0.2 10*3/uL (ref 0.0–0.4)
Eos: 3 %
Hematocrit: 44.7 % (ref 37.5–51.0)
Hemoglobin: 14.7 g/dL (ref 13.0–17.7)
Immature Grans (Abs): 0 10*3/uL (ref 0.0–0.1)
Immature Granulocytes: 0 %
Lymphocytes Absolute: 3.2 10*3/uL — ABNORMAL HIGH (ref 0.7–3.1)
Lymphs: 47 %
MCH: 28 pg (ref 26.6–33.0)
MCHC: 32.9 g/dL (ref 31.5–35.7)
MCV: 85 fL (ref 79–97)
Monocytes Absolute: 0.4 10*3/uL (ref 0.1–0.9)
Monocytes: 6 %
Neutrophils Absolute: 2.9 10*3/uL (ref 1.4–7.0)
Neutrophils: 43 %
Platelets: 251 10*3/uL (ref 150–450)
RBC: 5.25 x10E6/uL (ref 4.14–5.80)
RDW: 14.7 % (ref 11.6–15.4)
WBC: 6.7 10*3/uL (ref 3.4–10.8)

## 2020-04-13 LAB — LIPID PANEL
Chol/HDL Ratio: 4.8 ratio (ref 0.0–5.0)
Cholesterol, Total: 208 mg/dL — ABNORMAL HIGH (ref 100–199)
HDL: 43 mg/dL (ref >39)
LDL Chol Calc (NIH): 141 mg/dL — ABNORMAL HIGH (ref 0–99)
Triglycerides: 132 mg/dL (ref 0–149)
VLDL Cholesterol Cal: 24 mg/dL (ref 5–40)

## 2020-04-24 ENCOUNTER — Encounter: Payer: Self-pay | Admitting: *Deleted

## 2020-04-28 ENCOUNTER — Ambulatory Visit (HOSPITAL_COMMUNITY): Payer: Self-pay | Admitting: Psychiatry

## 2020-05-30 DIAGNOSIS — F209 Schizophrenia, unspecified: Secondary | ICD-10-CM | POA: Diagnosis not present

## 2020-07-18 DIAGNOSIS — F209 Schizophrenia, unspecified: Secondary | ICD-10-CM | POA: Diagnosis not present

## 2020-09-29 DIAGNOSIS — F209 Schizophrenia, unspecified: Secondary | ICD-10-CM | POA: Diagnosis not present

## 2020-11-23 DIAGNOSIS — F209 Schizophrenia, unspecified: Secondary | ICD-10-CM | POA: Diagnosis not present

## 2020-12-07 ENCOUNTER — Other Ambulatory Visit: Payer: Self-pay

## 2020-12-07 ENCOUNTER — Emergency Department (HOSPITAL_COMMUNITY): Payer: BC Managed Care – PPO

## 2020-12-07 ENCOUNTER — Encounter (HOSPITAL_COMMUNITY): Payer: Self-pay

## 2020-12-07 ENCOUNTER — Emergency Department (HOSPITAL_COMMUNITY)
Admission: EM | Admit: 2020-12-07 | Discharge: 2020-12-07 | Disposition: A | Discharge status: Home / Self Care | Payer: BC Managed Care – PPO | Attending: Emergency Medicine | Admitting: Emergency Medicine

## 2020-12-07 DIAGNOSIS — S99912A Unspecified injury of left ankle, initial encounter: Secondary | ICD-10-CM | POA: Diagnosis not present

## 2020-12-07 DIAGNOSIS — W1842XA Slipping, tripping and stumbling without falling due to stepping into hole or opening, initial encounter: Secondary | ICD-10-CM | POA: Insufficient documentation

## 2020-12-07 DIAGNOSIS — Y92007 Garden or yard of unspecified non-institutional (private) residence as the place of occurrence of the external cause: Secondary | ICD-10-CM | POA: Diagnosis not present

## 2020-12-07 DIAGNOSIS — J45909 Unspecified asthma, uncomplicated: Secondary | ICD-10-CM | POA: Insufficient documentation

## 2020-12-07 DIAGNOSIS — S93412S Sprain of calcaneofibular ligament of left ankle, sequela: Secondary | ICD-10-CM | POA: Diagnosis not present

## 2020-12-07 DIAGNOSIS — S93412A Sprain of calcaneofibular ligament of left ankle, initial encounter: Secondary | ICD-10-CM | POA: Diagnosis not present

## 2020-12-07 DIAGNOSIS — M7989 Other specified soft tissue disorders: Secondary | ICD-10-CM | POA: Diagnosis not present

## 2020-12-07 NOTE — Discharge Instructions (Addendum)
You were seen in the ER for your left ankle pain after you rolled her ankle yesterday afternoon.  Your x-ray and physical exam were very reassuring.  There are no broken bones or dislocations.  You have suffered a sprain of the ligaments in your left ankle.  For this reason you were placed in a supportive brace which you may wear as needed next days.  Crutches are available if you need them, however you should begin to try to bear weight on the ankle as you are able over the next few days.  You may ice the joint for 15 to 20 minutes at a time several times a day, and may take Tylenol or ibuprofen as needed at home for your pain.  Return to the emergency department if you develop any worsening pain, swelling, numbness, tingling, or weakness in your lower leg or foot.

## 2020-12-07 NOTE — ED Provider Notes (Signed)
[facility]-EMERGENCY DEPT Provider Note   CSN: [id number] Arrival date & time: 12/07/20  1308     History Chief Complaint  Patient presents with  . Ankle Pain    Nathan Wilkerson is a 30 y.o. male who presents to the emergency department after rolling his ankle yesterday afternoon around 4 PM.  He states he was exiting the shed in his backyard stepped on some uneven ground and inverted his foot.  He has noted some mild swelling and significant discomfort to the ankle since that time, is walking with a walking stick which he has from home, states it is uncomfortable to bear weight.  He is on Abilify and Remeron at home and did not feel comfortable adding any pain medication today such as Tylenol or ibuprofen as he was unsure if that was safe.  He denies any numbness, tingling, weakness in the lower leg or foot.  I personally reviewed this patient's medical records.  He has history of asthma and schizophrenia on Abilify and Remeron.  HPI     Past Medical History:  Diagnosis Date  . Asthma    Phreesia 03/27/2020    Patient Active Problem List   Diagnosis Date Noted  . History of schizophrenia 04/12/2020    History reviewed. No pertinent surgical history.     Family History  Problem Relation Age of Onset  . Diabetes Father   . Diabetes Maternal Grandmother   . Diabetes Paternal Grandmother     Social History   Tobacco Use  . Smoking status: Unknown If Ever Smoked  . Smokeless tobacco: Never Used  Substance Use Topics  . Alcohol use: Not Currently  . Drug use: Not Currently    Home Medications Prior to Admission medications   Medication Sig Start Date End Date Taking? Authorizing Provider  ARIPiprazole (ABILIFY) 20 MG tablet Take 20 mg by mouth daily.    [provider]  mirtazapine (REMERON) 15 MG tablet Take 15 mg by mouth at bedtime.    [provider]  OVER THE COUNTER MEDICATION     [provider]    Allergies     Amoxicillin  Review of Systems   Review of Systems  Constitutional: Negative.   HENT: Negative.   Respiratory: Negative.   Cardiovascular: Negative.   Gastrointestinal: Negative.   Genitourinary: Negative.   Musculoskeletal: Positive for joint swelling.  Skin: Negative.   Neurological: Negative.     Physical Exam Updated Vital Signs BP (!) 156/107 (BP Location: Right Arm)   Pulse (!) 101   Temp 98.7 F (37.1 C) (Oral)   Resp 18   SpO2 95%   Physical Exam Vitals and nursing note reviewed.  Constitutional:      Appearance: He is obese.  HENT:     Head: Normocephalic and atraumatic.  Eyes:     General: No scleral icterus.       Right eye: No discharge.        Left eye: No discharge.     Conjunctiva/sclera: Conjunctivae normal.  Cardiovascular:     Rate and Rhythm: Normal rate.     Pulses: Normal pulses.  Pulmonary:     Effort: Pulmonary effort is normal.  Musculoskeletal:        General: Swelling and tenderness present.     Right hip: Normal.     Left hip: Normal.     Right upper leg: Normal.     Left upper leg: Normal.     Right knee:  Normal.     Left knee: Normal.     Right lower leg: Normal. No edema.     Left lower leg: Swelling and tenderness present. No edema.     Right ankle: Normal.     Right Achilles Tendon: Normal.     Left ankle: Normal.     Left Achilles Tendon: Normal.     Right foot: Normal.     Left foot: Normal.       Feet:     Comments: Normal cap refill in the toes feet bilaterally.  5/5 strength in plantar dorsiflexion bilaterally no pain is elicited in the left ankle with this effort.  Skin:    General: Skin is warm and dry.     Capillary Refill: Capillary refill takes less than 2 seconds.  Neurological:     General: No focal deficit present.     Mental Status: He is alert and oriented to person, place, and time.     Sensory: Sensation is intact.     Motor: Motor function is intact.  Psychiatric:        Mood and Affect: Mood  normal.     ED Results / Procedures / Treatments   Labs (all labs ordered are listed, but only abnormal results are displayed) Labs Reviewed - No data to display  EKG None  Radiology DG Ankle Complete Left  Result Date: 12/07/2020 CLINICAL DATA:  Twisted ankle with swelling and pain EXAM: LEFT ANKLE COMPLETE - 3+ VIEW COMPARISON:  None. FINDINGS: There is no evidence of fracture, dislocation, or joint effusion. Soft tissue swelling especially seen laterally. IMPRESSION: Soft tissue swelling without fracture or subluxation. Electronically Signed   By: Marnee Spring M.D.   On: 12/07/2020 06:24    Procedures Procedures   Medications Ordered in ED Medications - No data to display  ED Course  I have reviewed the triage vital signs and the nursing notes.  Pertinent labs & imaging results that were available during my care of the patient were reviewed by me and considered in my medical decision making (see chart for details).  Clinical Course as of 12/07/20 0741  Thu Dec 07, 2020  0705 DG Ankle Complete Left [RS]    Clinical Course User Index [RS] , Eugene Gavia, PA-C   MDM Rules/Calculators/A&P                         30 year old male presents with pain and swelling to his left ankle after inverting his foot on uneven ground yesterday.  Hypertensive in intake, vital signs otherwise normal.  Physical exam revealed mild swelling and tenderness to palpation over the left lateral malleolus of the ankle, without signs of infection or bruising.  Normal sensation, cap refill, and motor function of the left foot and ankle.  Plain film obtained in triage negative for acute fracture or dislocation.  Will place patient in ASO brace and offered crutches for patient to use in the interim.  Recommend ice and OTC analgesia.  Weightbearing as tolerated.  May follow-up with PCP.  Nathan Wilkerson  voiced understanding of his medical evaluation and treatment plan.  Each of his questions was  answered to his expressed satisfaction.  Return precautions given.  Patient is well-appearing, stable, and appropriate for discharge at this time.  This chart was dictated using voice recognition software, Dragon. Despite the best efforts of this provider to proofread and correct errors, errors may still occur which can change documentation meaning.  Final Clinical Impression(s) / ED Diagnoses Final diagnoses:  Sprain of calcaneofibular ligament of left ankle, initial encounter    Rx / DC Orders ED Discharge Orders    None       Sherrilee Gilles 12/07/20 0748    Wynetta Fines, MD 12/09/20 2202

## 2020-12-07 NOTE — ED Triage Notes (Signed)
Pt reports left ankle pain after tripping in the shed.

## 2020-12-07 NOTE — ED Notes (Addendum)
This RN reviewed discharge instructions including pain management, follow up instructions, ankle ROM exercises using teach back method. Pt verbalized understanding and denies any questions. No further care needed at this time. This RN also reviewed proper application of ankle brace and utilization of crutches

## 2020-12-28 DIAGNOSIS — Z79899 Other long term (current) drug therapy: Secondary | ICD-10-CM | POA: Diagnosis not present

## 2021-01-11 DIAGNOSIS — F209 Schizophrenia, unspecified: Secondary | ICD-10-CM | POA: Diagnosis not present

## 2021-02-23 DIAGNOSIS — Z20822 Contact with and (suspected) exposure to covid-19: Secondary | ICD-10-CM | POA: Diagnosis not present

## 2021-02-23 DIAGNOSIS — R509 Fever, unspecified: Secondary | ICD-10-CM | POA: Diagnosis not present

## 2021-02-23 DIAGNOSIS — R519 Headache, unspecified: Secondary | ICD-10-CM | POA: Diagnosis not present

## 2021-02-26 ENCOUNTER — Emergency Department (HOSPITAL_COMMUNITY): Payer: BC Managed Care – PPO

## 2021-02-26 ENCOUNTER — Emergency Department (HOSPITAL_COMMUNITY)
Admission: EM | Admit: 2021-02-26 | Discharge: 2021-02-27 | Disposition: A | Discharge status: Home / Self Care | Payer: BC Managed Care – PPO | Attending: Emergency Medicine | Admitting: Emergency Medicine

## 2021-02-26 ENCOUNTER — Other Ambulatory Visit: Payer: Self-pay

## 2021-02-26 ENCOUNTER — Encounter (HOSPITAL_COMMUNITY): Payer: Self-pay | Admitting: *Deleted

## 2021-02-26 DIAGNOSIS — R112 Nausea with vomiting, unspecified: Secondary | ICD-10-CM | POA: Diagnosis not present

## 2021-02-26 DIAGNOSIS — R945 Abnormal results of liver function studies: Secondary | ICD-10-CM | POA: Diagnosis not present

## 2021-02-26 DIAGNOSIS — Z20822 Contact with and (suspected) exposure to covid-19: Secondary | ICD-10-CM | POA: Insufficient documentation

## 2021-02-26 DIAGNOSIS — J189 Pneumonia, unspecified organism: Secondary | ICD-10-CM | POA: Diagnosis not present

## 2021-02-26 DIAGNOSIS — R519 Headache, unspecified: Secondary | ICD-10-CM | POA: Diagnosis not present

## 2021-02-26 DIAGNOSIS — R5381 Other malaise: Secondary | ICD-10-CM | POA: Insufficient documentation

## 2021-02-26 DIAGNOSIS — R7989 Other specified abnormal findings of blood chemistry: Secondary | ICD-10-CM

## 2021-02-26 DIAGNOSIS — R197 Diarrhea, unspecified: Secondary | ICD-10-CM | POA: Diagnosis not present

## 2021-02-26 DIAGNOSIS — R000 Tachycardia, unspecified: Secondary | ICD-10-CM | POA: Diagnosis not present

## 2021-02-26 DIAGNOSIS — R509 Fever, unspecified: Secondary | ICD-10-CM | POA: Diagnosis not present

## 2021-02-26 DIAGNOSIS — R7401 Elevation of levels of liver transaminase levels: Secondary | ICD-10-CM | POA: Diagnosis not present

## 2021-02-26 DIAGNOSIS — J45909 Unspecified asthma, uncomplicated: Secondary | ICD-10-CM | POA: Diagnosis not present

## 2021-02-26 DIAGNOSIS — R059 Cough, unspecified: Secondary | ICD-10-CM | POA: Diagnosis not present

## 2021-02-26 LAB — CBC WITH DIFFERENTIAL/PLATELET
Abs Immature Granulocytes: 0.02 10*3/uL (ref 0.00–0.07)
Basophils Absolute: 0 10*3/uL (ref 0.0–0.1)
Basophils Relative: 0 %
Eosinophils Absolute: 0.1 10*3/uL (ref 0.0–0.5)
Eosinophils Relative: 1 %
HCT: 41 % (ref 39.0–52.0)
Hemoglobin: 13.2 g/dL (ref 13.0–17.0)
Immature Granulocytes: 0 %
Lymphocytes Relative: 24 %
Lymphs Abs: 1.8 10*3/uL (ref 0.7–4.0)
MCH: 27.8 pg (ref 26.0–34.0)
MCHC: 32.2 g/dL (ref 30.0–36.0)
MCV: 86.3 fL (ref 80.0–100.0)
Monocytes Absolute: 1.1 10*3/uL — ABNORMAL HIGH (ref 0.1–1.0)
Monocytes Relative: 15 %
Neutro Abs: 4.5 10*3/uL (ref 1.7–7.7)
Neutrophils Relative %: 60 %
Platelets: 228 10*3/uL (ref 150–400)
RBC: 4.75 MIL/uL (ref 4.22–5.81)
RDW: 15 % (ref 11.5–15.5)
WBC: 7.5 10*3/uL (ref 4.0–10.5)
nRBC: 0 % (ref 0.0–0.2)

## 2021-02-26 LAB — COMPREHENSIVE METABOLIC PANEL
ALT: 69 U/L — ABNORMAL HIGH (ref 0–44)
AST: 98 U/L — ABNORMAL HIGH (ref 15–41)
Albumin: 3.6 g/dL (ref 3.5–5.0)
Alkaline Phosphatase: 36 U/L — ABNORMAL LOW (ref 38–126)
Anion gap: 9 (ref 5–15)
BUN: 7 mg/dL (ref 6–20)
CO2: 24 mmol/L (ref 22–32)
Calcium: 8.6 mg/dL — ABNORMAL LOW (ref 8.9–10.3)
Chloride: 99 mmol/L (ref 98–111)
Creatinine, Ser: 1.08 mg/dL (ref 0.61–1.24)
GFR, Estimated: >60 mL/min (ref >60)
Glucose, Bld: 104 mg/dL — ABNORMAL HIGH (ref 70–99)
Potassium: 4.3 mmol/L (ref 3.5–5.1)
Sodium: 132 mmol/L — ABNORMAL LOW (ref 135–145)
Total Bilirubin: 0.5 mg/dL (ref 0.3–1.2)
Total Protein: 7.8 g/dL (ref 6.5–8.1)

## 2021-02-26 LAB — RESP PANEL BY RT-PCR (FLU A&B, COVID) ARPGX2
Influenza A by PCR: NEGATIVE
Influenza B by PCR: NEGATIVE
SARS Coronavirus 2 by RT PCR: NEGATIVE

## 2021-02-26 MED ORDER — IBUPROFEN 800 MG PO TABS
800.0000 mg | ORAL_TABLET | Freq: Once | ORAL | Status: AC
  Administered 2021-02-26: 800 mg via ORAL
  Filled 2021-02-26: qty 1

## 2021-02-26 MED ORDER — ONDANSETRON 4 MG PO TBDP
4.0000 mg | ORAL_TABLET | Freq: Once | ORAL | Status: AC
  Administered 2021-02-27: 4 mg via ORAL
  Filled 2021-02-26: qty 1

## 2021-02-26 NOTE — ED Provider Notes (Signed)
Emergency Medicine Provider Triage Evaluation Note  Nathan Wilkerson , a 30 y.o. male  was evaluated in triage.  Pt complains of nausea vomiting abdominal pain and diarrhea since Thursday.  He states that he went to a cookout nearly a week before this however states that no one else got sick from the cookout.  He denies any recent antibiotics no history of C. difficile no recent travel.  He states that he took his temperature at home and it was 101 however here in the ER somebody was 103.6.  Denies any chest pain or shortness of breath no cough congestion fevers or chills.  He states that he was tested for COVID-19 and was found to be negative by PCR several days ago.  States that at this point he needs something for the nausea vomiting and diarrhea.  States that he is still able to get around to do his normal daily activities.  Review of Systems  Positive: Nausea, vomiting, abdominal pain, diarrhea Negative: Chest pain, shortness of breath, cough  Physical Exam  BP (!) 155/84 (BP Location: Right Arm)   Pulse (!) 110   Temp (!) 103.6 F (39.8 C) (Oral)   Resp 16   Ht 5\' 8"  (1.727 m)   Wt 127.9 kg   SpO2 99%   BMI 42.88 kg/m  Gen:   Awake, no distress  Resp:  Normal effort  MSK:   Moves extremities without difficulty  Other:  Patient has protuberant obese abdomen with no tenderness, guarding or rebound.  Is overall well-appearing does not appear toxic.  Is somewhat warm to touch.  No CVA tenderness.  Medical Decision Making  Medically screening exam initiated at 7:01 PM.  Appropriate orders placed.  Nathan Wilkerson was informed that the remainder of the evaluation will be completed by another provider, this initial triage assessment does not replace that evaluation, and the importance of remaining in the ED until their evaluation is complete.  Patient is febrile and tachycardic but overall quite well-appearing.  I suspect viral gastroenteritis given his nausea vomiting and diarrhea he has no  abdominal tenderness.  Will obtain routine abdominal pain labs to evaluate his electrolytes given his vomiting and diarrhea.  Given ibuprofen by RN.  I will put an order for ODT Zofran.  Will encourage p.o. fluids.  Patient is agreeable to waiting in the ER for remainder of evaluation.   Carlisle Cater Harper, DOLE 02/26/21 04/28/21, MD 02/26/21 1925

## 2021-02-26 NOTE — ED Triage Notes (Signed)
Pt is here with illness that includes fever and headache.  Pt neck supple.  This began Thursday. Pt took tylenol and ibuprofen this am around 7:30am. Pt denies having been around anyone that has been sick.

## 2021-02-26 NOTE — ED Notes (Signed)
3 cups ice water given which pt drank

## 2021-02-26 NOTE — ED Triage Notes (Signed)
Pt states that he has had 2 covid tests and they were both negative

## 2021-02-27 ENCOUNTER — Encounter (HOSPITAL_COMMUNITY): Payer: Self-pay | Admitting: Student

## 2021-02-27 MED ORDER — ACETAMINOPHEN ER 650 MG PO TBCR: 650.0000 mg | EXTENDED_RELEASE_TABLET | Freq: Three times a day (TID) | ORAL | 0 refills | Status: DC | PRN

## 2021-02-27 MED ORDER — DOXYCYCLINE HYCLATE 100 MG PO CAPS: 100.0000 mg | ORAL_CAPSULE | Freq: Two times a day (BID) | ORAL | 0 refills | Status: DC

## 2021-02-27 MED ORDER — NAPROXEN 500 MG PO TABS: 500.0000 mg | ORAL_TABLET | Freq: Two times a day (BID) | ORAL | 0 refills | Status: DC | PRN

## 2021-02-27 MED ORDER — ACETAMINOPHEN 500 MG PO TABS
1000.0000 mg | ORAL_TABLET | Freq: Once | ORAL | Status: AC
  Administered 2021-02-27: 1000 mg via ORAL
  Filled 2021-02-27: qty 2

## 2021-02-27 MED ORDER — ONDANSETRON 4 MG PO TBDP: 4.0000 mg | ORAL_TABLET | Freq: Three times a day (TID) | ORAL | 0 refills | Status: DC | PRN

## 2021-02-27 NOTE — Discharge Instructions (Signed)
You were seen in the emergency department for fever with several other symptoms.  Your COVID/flu test were negative.  Your labs did show that your liver function tests were somewhat elevated, we would like you to have these rechecked by your primary care provider.  Your chest x-ray showed findings of pneumonia, we would like you to have a repeat chest x-ray after completion of antibiotics.  We are sending you home with Zofran to take every 8 hours as needed for nausea and vomiting.  Tylenol to take every 8 hours as needed for pain/fever as well as naproxen to take every 12 hours as needed for pain/fever.  Please be sure to take the naproxen with food as it can cause stomach upset and or stomach bleeding.  Do not take other NSAIDs such as Motrin, Advil, Aleve, Goody powder, Mobic, etc. as these are similar.   We are sending you home with doxycycline which is an antibiotic to treat the pneumonia.  Please take the doxycycline with food as well as it can cause an upset stomach.  We have prescribed you new medication(s) today. Discuss the medications prescribed today with your pharmacist as they can have adverse effects and interactions with your other medicines including over the counter and prescribed medications. Seek medical evaluation if you start to experience new or abnormal symptoms after taking one of these medicines, seek care immediately if you start to experience difficulty breathing, feeling of your throat closing, facial swelling, or rash as these could be indications of a more serious allergic reaction  Please follow attached diet guidelines.  Please follow-up with your primary care provider within 3 days for reevaluation of your symptoms.  Return to the ER for any new or worsening symptoms including but not limited to new or worsening pain, trouble breathing, passing out, chest pain, abdominal pain, blood in vomit or stool, coughing up clots of blood, or any other concerns.

## 2021-02-27 NOTE — ED Provider Notes (Signed)
[facility]-EMERGENCY DEPT Provider Note   CSN: [id number] Arrival date & time: 02/26/21  1805     History Chief Complaint  Patient presents with  . Illness    Nathan Wilkerson is a 30 y.o. male with a history of schizophrenia who presents to the emergency department with complaints of generally not feeling well x 5 days.  Patient reports fevers, chills, malaise, headaches (gradual onset, similar to prior), cough intermittently productive of mucousy sputum, nausea, 2-3 episodes of emesis per day, and multiple episodes of diarrhea per day.  No significant alleviating or aggravating factors to his symptoms.  He has taken some COVID-19 test at home that have been negative.  He denies recent travel outside of the country or antibiotics.  He denies chest pain, dyspnea, congestion, ear pain, abdominal pain, melena, hematochezia, hematemesis, leg pain/swelling, hemoptysis, or syncope. Denies tick exposure.    HPI     Past Medical History:  Diagnosis Date  . Asthma    [employer] 03/27/2020    Patient Active Problem List   Diagnosis Date Noted  . History of schizophrenia 04/12/2020    History reviewed. No pertinent surgical history.     Family History  Problem Relation Age of Onset  . Diabetes Father   . Diabetes Maternal Grandmother   . Diabetes Paternal Grandmother     Social History   Tobacco Use  . Smoking status: Unknown If Ever Smoked  . Smokeless tobacco: Never Used  Substance Use Topics  . Alcohol use: Not Currently  . Drug use: Not Currently    Home Medications Prior to Admission medications   Medication Sig Start Date End Date Taking? Authorizing Provider  ARIPiprazole (ABILIFY) 20 MG tablet Take 20 mg by mouth daily.    [provider]  mirtazapine (REMERON) 15 MG tablet Take 15 mg by mouth at bedtime.    [provider]  OVER THE COUNTER MEDICATION     [provider]    Allergies    Amoxicillin  Review of  Systems   Review of Systems  Constitutional: Positive for chills and fever.  HENT: Negative for congestion and ear pain.   Respiratory: Positive for cough. Negative for shortness of breath.   Cardiovascular: Negative for chest pain and leg swelling.  Gastrointestinal: Positive for diarrhea, nausea and vomiting. Negative for abdominal pain, anal bleeding and blood in stool.  Genitourinary: Negative for dysuria.  Neurological: Negative for syncope.  All other systems reviewed and are negative.   Physical Exam Updated Vital Signs BP 132/86   Pulse (!) 105   Temp (!) 100.6 F (38.1 C) (Oral)   Resp 20   Ht 5\' 8"  (1.727 m)   Wt 127.9 kg   SpO2 100%   BMI 42.88 kg/m   Physical Exam Vitals and nursing note reviewed.  Constitutional:      General: He is not in acute distress.    Appearance: He is well-developed. He is not toxic-appearing.  HENT:     Head: Normocephalic and atraumatic.     Nose:     Comments: No sinus tenderness.    Mouth/Throat:     Mouth: Mucous membranes are moist.  Eyes:     General:        Right eye: No discharge.        Left eye: No discharge.     Conjunctiva/sclera: Conjunctivae normal.  Cardiovascular:     Rate and Rhythm: Regular rhythm. Tachycardia present.  Pulmonary:  Effort: Pulmonary effort is normal. No respiratory distress.     Breath sounds: Normal breath sounds. No wheezing, rhonchi or rales.  Abdominal:     General: There is no distension.     Palpations: Abdomen is soft.     Tenderness: There is no abdominal tenderness. There is no guarding or rebound.  Musculoskeletal:     Cervical back: Normal range of motion and neck supple. No rigidity.  Skin:    General: Skin is warm and dry.     Findings: No rash.  Neurological:     Mental Status: He is alert.     Comments: Clear speech.  Moving all extremities.  Ambulatory.  Psychiatric:        Behavior: Behavior normal.    ED Results / Procedures / Treatments   Labs (all labs  ordered are listed, but only abnormal results are displayed) Labs Reviewed  COMPREHENSIVE METABOLIC PANEL - Abnormal; Notable for the following components:      Result Value   Sodium 132 (*)    Glucose, Bld 104 (*)    Calcium 8.6 (*)    AST 98 (*)    ALT 69 (*)    Alkaline Phosphatase 36 (*)    All other components within normal limits  CBC WITH DIFFERENTIAL/PLATELET - Abnormal; Notable for the following components:   Monocytes Absolute 1.1 (*)    All other components within normal limits  RESP PANEL BY RT-PCR (FLU A&B, COVID) ARPGX2  URINALYSIS, ROUTINE W REFLEX MICROSCOPIC    EKG None  Radiology DG Chest 2 View  Result Date: 02/26/2021 CLINICAL DATA:  Fever. EXAM: CHEST - 2 VIEW COMPARISON:  None. FINDINGS: Airspace opacities in the medial aspect of the right upper lung. No visible pleural effusions or pneumothorax. Portions of the mediastinum are obscured on the right. Non obscured cardiomediastinal silhouette is within normal limits. No acute osseous abnormality. IMPRESSION: Airspace opacities in the medial aspect of the right upper lung, compatible with pneumonia. Recommend imaging follow-up to resolution to exclude underlying mass. Electronically Signed   By: Feliberto Harts MD   On: 02/26/2021 19:18    Procedures Procedures   Medications Ordered in ED Medications  ondansetron (ZOFRAN-ODT) disintegrating tablet 4 mg (has no administration in time range)  acetaminophen (TYLENOL) tablet 1,000 mg (has no administration in time range)  ibuprofen (ADVIL) tablet 800 mg (800 mg Oral Given 02/26/21 1834)    ED Course  I have reviewed the triage vital signs and the nursing notes.  Pertinent labs & imaging results that were available during my care of the patient were reviewed by me and considered in my medical decision making (see chart for details).    MDM Rules/Calculators/A&P                          Patient presents to the ED with complaints of malaise with N/V/D, cough,  & headaches. Nontoxic, febrile with likely resultant tachycardia.   Additional history obtained:  Additional history obtained from chart review & nursing note review.   Lab Tests:  I reviewed and interpreted labs, which included:  CBC: Unremarkable, no leukocytosis. CMP: Mild transaminitis.  Renal function preserved. COVID/influenza testing: Negative  Imaging Studies ordered:  Chest x-ray ordered in triage, I independently reviewed, formal radiology impression shows: Airspace opacities in the medial aspect of the right upper lung, compatible with pneumonia. Recommend imaging follow-up to resolution to exclude underlying mass.  ED Course:  Suspect patient's illness  is multifactorial with findings of pneumonia on chest x-ray and a component of a viral GI illness.  His labs do not show findings of significant electrolyte derangement, mild hyponatremia/calcemia, his renal function is preserved, and he does not have a significant leukocytosis.  His COVID-19 and influenza test are negative. He has had headaches, however he has no nuchal rigidity or focal neurologic deficits. No recent tick exposures or rash to raise concern for tick born illness. From a pneumonia standpoint he is saturating 100% on room air without signs of respiratory distress, he has no complaints of dyspnea and his breath sounds on exam are clear, no wheezing.  We will treat this with doxycycline.  In terms of his nausea, vomiting, and diarrhea, his abdomen is soft, nontender, and without peritoneal signs on serial abdominal exams.  I have low suspicion for acute surgical abdominal pathology.    Patient was offered IV fluids/therapy, he would like to avoid this after an extended time in the emergency department waiting for a bed- relays he feels ready to go home which I feel is reasonable.  He was given ODT Zofran and Tylenol with improvement, he is tolerating p.o. without difficulty and is ready to go home. He overall appears  appropriate for discharge with close primary care follow up. I discussed results, treatment plan, need for follow-up, and return precautions with the patient. Provided opportunity for questions, patient confirmed understanding and is in agreement with plan.   Portions of this note were generated with Scientist, clinical (histocompatibility and immunogenetics). Dictation errors may occur despite best attempts at proofreading.  Final Clinical Impression(s) / ED Diagnoses Final diagnoses:  Community acquired pneumonia of right upper lobe of lung  Nausea vomiting and diarrhea  Elevated LFTs    Rx / DC Orders ED Discharge Orders         Ordered    ondansetron (ZOFRAN ODT) 4 MG disintegrating tablet  Every 8 hours PRN        02/27/21 0512    doxycycline (VIBRAMYCIN) 100 MG capsule  2 times daily        02/27/21 0512    naproxen (NAPROSYN) 500 MG tablet  2 times daily PRN        02/27/21 0512    acetaminophen (TYLENOL 8 HOUR) 650 MG CR tablet  Every 8 hours PRN        02/27/21 0512           Cherly Anderson, PA-C 02/27/21 0517    Nira Conn, MD 02/27/21 [id number]

## 2021-03-12 DIAGNOSIS — F209 Schizophrenia, unspecified: Secondary | ICD-10-CM | POA: Diagnosis not present

## 2021-03-15 ENCOUNTER — Other Ambulatory Visit: Payer: Self-pay

## 2021-03-15 ENCOUNTER — Encounter: Payer: Self-pay | Admitting: Emergency Medicine

## 2021-03-15 ENCOUNTER — Ambulatory Visit: Payer: BC Managed Care – PPO | Admitting: Emergency Medicine

## 2021-03-15 ENCOUNTER — Ambulatory Visit (INDEPENDENT_AMBULATORY_CARE_PROVIDER_SITE_OTHER): Payer: BC Managed Care – PPO

## 2021-03-15 VITALS — BP 116/60 | HR 80 | Temp 98.9°F | Ht 68.0 in | Wt 281.0 lb

## 2021-03-15 DIAGNOSIS — R7401 Elevation of levels of liver transaminase levels: Secondary | ICD-10-CM | POA: Diagnosis not present

## 2021-03-15 DIAGNOSIS — J189 Pneumonia, unspecified organism: Secondary | ICD-10-CM | POA: Insufficient documentation

## 2021-03-15 DIAGNOSIS — Z8701 Personal history of pneumonia (recurrent): Secondary | ICD-10-CM | POA: Diagnosis not present

## 2021-03-15 LAB — CBC WITH DIFFERENTIAL/PLATELET
Basophils Absolute: 0.1 10*3/uL (ref 0.0–0.1)
Basophils Relative: 1.1 % (ref 0.0–3.0)
Eosinophils Absolute: 0.2 10*3/uL (ref 0.0–0.7)
Eosinophils Relative: 2.7 % (ref 0.0–5.0)
HCT: 39.6 % (ref 39.0–52.0)
Hemoglobin: 13.1 g/dL (ref 13.0–17.0)
Lymphocytes Relative: 54.3 % — ABNORMAL HIGH (ref 12.0–46.0)
Lymphs Abs: 3.4 10*3/uL (ref 0.7–4.0)
MCHC: 33.2 g/dL (ref 30.0–36.0)
MCV: 83.7 fl (ref 78.0–100.0)
Monocytes Absolute: 0.5 10*3/uL (ref 0.1–1.0)
Monocytes Relative: 8.2 % (ref 3.0–12.0)
Neutro Abs: 2.1 10*3/uL (ref 1.4–7.7)
Neutrophils Relative %: 33.7 % — ABNORMAL LOW (ref 43.0–77.0)
Platelets: 283 10*3/uL (ref 150.0–400.0)
RBC: 4.72 Mil/uL (ref 4.22–5.81)
RDW: 15.3 % (ref 11.5–15.5)
WBC: 6.2 10*3/uL (ref 4.0–10.5)

## 2021-03-15 LAB — COMPREHENSIVE METABOLIC PANEL
ALT: 64 U/L — ABNORMAL HIGH (ref 0–53)
AST: 39 U/L — ABNORMAL HIGH (ref 0–37)
Albumin: 4.4 g/dL (ref 3.5–5.2)
Alkaline Phosphatase: 45 U/L (ref 39–117)
BUN: 13 mg/dL (ref 6–23)
CO2: 27 mEq/L (ref 19–32)
Calcium: 9.8 mg/dL (ref 8.4–10.5)
Chloride: 105 mEq/L (ref 96–112)
Creatinine, Ser: 0.92 mg/dL (ref 0.40–1.50)
GFR: 113.17 mL/min (ref >60.00)
Glucose, Bld: 90 mg/dL (ref 70–99)
Potassium: 4.2 mEq/L (ref 3.5–5.1)
Sodium: 140 mEq/L (ref 135–145)
Total Bilirubin: 0.4 mg/dL (ref 0.2–1.2)
Total Protein: 7.6 g/dL (ref 6.0–8.3)

## 2021-03-15 NOTE — Assessment & Plan Note (Signed)
Clinically much improved.  We will repeat chest x-ray today. Finished course of antibiotic.  No concerns.

## 2021-03-15 NOTE — Progress Notes (Signed)
Nathan Wilkerson 30 y.o.   Chief Complaint  Patient presents with   Hospitalization Follow-up    02/26/2021 and disability forms to complete    HISTORY OF PRESENT ILLNESS: This is a 30 y.o. male here for follow-up of emergency department visit on 02/26/2021 when he was diagnosed with community-acquired pneumonia.  Treated with doxycycline.  Much better.  States he is 100% improved. Has disability forms that need to be filled out. No other complaints or medical concerns today.  HPI   Prior to Admission medications   Medication Sig Start Date End Date Taking? Authorizing Provider  ARIPiprazole (ABILIFY) 20 MG tablet Take 20 mg by mouth daily.   Yes [provider]  mirtazapine (REMERON) 15 MG tablet Take 15 mg by mouth at bedtime.   Yes [provider]  naproxen (NAPROSYN) 500 MG tablet Take 1 tablet (500 mg total) by mouth 2 (two) times daily as needed for moderate pain. 02/27/21  Yes Petrucelli, Samantha R, PA-C  ondansetron (ZOFRAN ODT) 4 MG disintegrating tablet Take 1 tablet (4 mg total) by mouth every 8 (eight) hours as needed for nausea or vomiting. 02/27/21  Yes Petrucelli, Samantha R, PA-C  OVER THE COUNTER MEDICATION    Yes [provider]  acetaminophen (TYLENOL 8 HOUR) 650 MG CR tablet Take 1 tablet (650 mg total) by mouth every 8 (eight) hours as needed for pain or fever. Patient not taking: Reported on 03/15/2021 02/27/21   Petrucelli, Lelon Mast R, PA-C  doxycycline (VIBRAMYCIN) 100 MG capsule Take 1 capsule (100 mg total) by mouth 2 (two) times daily. Patient not taking: Reported on 03/15/2021 02/27/21   Cherly Anderson, PA-C    Allergies  Allergen Reactions   Amoxicillin     Patient Active Problem List   Diagnosis Date Noted   History of schizophrenia 04/12/2020    Past Medical History:  Diagnosis Date   Asthma    [employer] 03/27/2020    No past surgical history on file.  Social History   Socioeconomic History   Marital status: Single     Spouse name: Not on file   Number of children: Not on file   Years of education: Not on file   Highest education level: Not on file  Occupational History   Not on file  Tobacco Use   Smoking status: Unknown   Smokeless tobacco: Never  Substance and Sexual Activity   Alcohol use: Not Currently   Drug use: Not Currently   Sexual activity: Not Currently  Other Topics Concern   Not on file  Social History Narrative   Not on file   Social Determinants of Health   Financial Resource Strain: Not on file  Food Insecurity: Not on file  Transportation Needs: Not on file  Physical Activity: Not on file  Stress: Not on file  Social Connections: Not on file  Intimate Partner Violence: Not on file    Family History  Problem Relation Age of Onset   Diabetes Father    Diabetes Maternal Grandmother    Diabetes Paternal Grandmother      Review of Systems  Constitutional: Negative.  Negative for chills and fever.  HENT: Negative.  Negative for congestion and sore throat.   Respiratory: Negative.  Negative for cough and shortness of breath.   Cardiovascular: Negative.  Negative for chest pain and palpitations.  Gastrointestinal:  Negative for abdominal pain, diarrhea, nausea and vomiting.  Genitourinary: Negative.  Negative for dysuria and hematuria.  Skin: Negative.  Negative for  rash.  Neurological: Negative.  Negative for dizziness and headaches.  All other systems reviewed and are negative.  Vitals:   03/15/21 1342  BP: 116/60  Pulse: 80  Temp: 98.9 F (37.2 C)  SpO2: 98%    Physical Exam Vitals reviewed.  Constitutional:      Appearance: Normal appearance.  HENT:     Head: Normocephalic.  Eyes:     Extraocular Movements: Extraocular movements intact.     Pupils: Pupils are equal, round, and reactive to light.  Cardiovascular:     Rate and Rhythm: Normal rate and regular rhythm.     Pulses: Normal pulses.     Heart sounds: Normal heart sounds.  Pulmonary:      Effort: Pulmonary effort is normal.     Breath sounds: Normal breath sounds.  Abdominal:     Palpations: Abdomen is soft.     Tenderness: There is no abdominal tenderness.  Musculoskeletal:        General: Normal range of motion.     Cervical back: Normal range of motion and neck supple.  Skin:    General: Skin is warm and dry.  Neurological:     General: No focal deficit present.     Mental Status: He is alert and oriented to person, place, and time.  Psychiatric:        Mood and Affect: Mood normal.        Behavior: Behavior normal.    DG Chest 2 View  Result Date: 03/15/2021 CLINICAL DATA:  Pneumonia follow-up EXAM: CHEST - 2 VIEW COMPARISON:  02/26/2021 FINDINGS: Right upper lobe consolidation has resolved. Lungs are clear. No pleural effusion. Normal heart size. IMPRESSION: Resolved pneumonia. Electronically Signed   By: Guadlupe Spanish M.D.   On: 03/15/2021 14:52    ASSESSMENT & PLAN: A total of 30 minutes was spent with the patient and counseling/coordination of care regarding recent episode of pneumonia, review of emergency departments notes, review of most recent chest x-ray and blood work results, disability paperwork, education on nutrition, prognosis, documentation, need for repeat blood work and chest x-ray to document pneumonia resolution and resolution of transaminitis, and need for follow-up as needed.  Community acquired pneumonia Clinically much improved.  We will repeat chest x-ray today. Finished course of antibiotic.  No concerns.  Transaminitis Blood work done today.  Asymptomatic.  No concerns.  Most likely related to bacterial infection [name] was seen today for hospitalization follow-up.  Diagnoses and all orders for this visit:  Community acquired pneumonia, unspecified laterality Comments: Much improved Orders: -     CBC with Differential/Platelet  History of recent pneumonia -     DG Chest 2 View  Transaminitis -     Comprehensive metabolic  panel  Other orders -     Cancel: Pneumococcal conjugate vaccine 13-valent IM  Patient Instructions  Health Maintenance, Male Adopting a healthy lifestyle and getting preventive care are important in promoting health and wellness. Ask your health care provider about: The right schedule for you to have regular tests and exams. Things you can do on your own to prevent diseases and keep yourself healthy. What should I know about diet, weight, and exercise? Eat a healthy diet  Eat a diet that includes plenty of vegetables, fruits, low-fat dairy products, and lean protein. Do not eat a lot of foods that are high in solid fats, added sugars, or sodium.  Maintain a healthy weight Body mass index (BMI) is a measurement that can be used  to identify possible weight problems. It estimates body fat based on height and weight. Your health care provider can help determine your BMI and help you achieve or maintain ahealthy weight. Get regular exercise Get regular exercise. This is one of the most important things you can do for your health. Most adults should: Exercise for at least 150 minutes each week. The exercise should increase your heart rate and make you sweat (moderate-intensity exercise). Do strengthening exercises at least twice a week. This is in addition to the moderate-intensity exercise. Spend less time sitting. Even light physical activity can be beneficial. Watch cholesterol and blood lipids Have your blood tested for lipids and cholesterol at 28 years of age, then havethis test every 5 years. You may need to have your cholesterol levels checked more often if: Your lipid or cholesterol levels are high. You are older than 29 years of age. You are at high risk for heart disease. What should I know about cancer screening? Many types of cancers can be detected early and may often be prevented. Depending on your health history and family history, you may need to have cancer screening at  various ages. This may include screening for: Colorectal cancer. Prostate cancer. Skin cancer. Lung cancer. What should I know about heart disease, diabetes, and high blood pressure? Blood pressure and heart disease High blood pressure causes heart disease and increases the risk of stroke. This is more likely to develop in people who have high blood pressure readings, are of African descent, or are overweight. Talk with your health care provider about your target blood pressure readings. Have your blood pressure checked: Every 3-5 years if you are 85-71 years of age. Every year if you are 29 years old or older. If you are between the ages of 18 and 37 and are a current or former smoker, ask your health care provider if you should have a one-time screening for abdominal aortic aneurysm (AAA). Diabetes Have regular diabetes screenings. This checks your fasting blood sugar level. Have the screening done: Once every three years after age 9 if you are at a normal weight and have a low risk for diabetes. More often and at a younger age if you are overweight or have a high risk for diabetes. What should I know about preventing infection? Hepatitis B If you have a higher risk for hepatitis B, you should be screened for this virus. Talk with your health care provider to find out if you are at risk forhepatitis B infection. Hepatitis C Blood testing is recommended for: Everyone born from [birth year] through 1964. Anyone with known risk factors for hepatitis C. Sexually transmitted infections (STIs) You should be screened each year for STIs, including gonorrhea and chlamydia, if: You are sexually active and are younger than 29 years of age. You are older than 29 years of age and your health care provider tells you that you are at risk for this type of infection. Your sexual activity has changed since you were last screened, and you are at increased risk for chlamydia or gonorrhea. Ask your health care  provider if you are at risk. Ask your health care provider about whether you are at high risk for HIV. Your health care provider may recommend a prescription medicine to help prevent HIV infection. If you choose to take medicine to prevent HIV, you should first get tested for HIV. You should then be tested every 3 months for as long as you are taking the medicine. Follow these  instructions at home: Lifestyle Do not use any products that contain nicotine or tobacco, such as cigarettes, e-cigarettes, and chewing tobacco. If you need help quitting, ask your health care provider. Do not use street drugs. Do not share needles. Ask your health care provider for help if you need support or information about quitting drugs. Alcohol use Do not drink alcohol if your health care provider tells you not to drink. If you drink alcohol: Limit how much you have to 0-2 drinks a day. Be aware of how much alcohol is in your drink. In the [place] one drink equals one 12 oz bottle of beer (355 mL), one 5 oz glass of wine (148 mL), or one 1 oz glass of hard liquor (44 mL). General instructions Schedule regular health, dental, and eye exams. Stay current with your vaccines. Tell your health care provider if: You often feel depressed. You have ever been abused or do not feel safe at home. Summary Adopting a healthy lifestyle and getting preventive care are important in promoting health and wellness. Follow your health care provider's instructions about healthy diet, exercising, and getting tested or screened for diseases. Follow your health care provider's instructions on monitoring your cholesterol and blood pressure. This information is not intended to replace advice given to you by your health care provider. Make sure you discuss any questions you have with your healthcare provider. Document Revised: 09/02/2018 Document Reviewed: 09/02/2018 Elsevier Patient Education  2022 [employer].   Edwina BarthMiguel Janaiah Vetrano,  MD [facility]

## 2021-03-15 NOTE — Assessment & Plan Note (Signed)
Blood work done today.  Asymptomatic.  No concerns.  Most likely related to bacterial infection

## 2021-03-15 NOTE — Patient Instructions (Signed)
Health Maintenance, Male Adopting a healthy lifestyle and getting preventive care are important in promoting health and wellness. Ask your health care provider about: The right schedule for you to have regular tests and exams. Things you can do on your own to prevent diseases and keep yourself healthy. What should I know about diet, weight, and exercise? Eat a healthy diet  Eat a diet that includes plenty of vegetables, fruits, low-fat dairy products, and lean protein. Do not eat a lot of foods that are high in solid fats, added sugars, or sodium.  Maintain a healthy weight Body mass index (BMI) is a measurement that can be used to identify possible weight problems. It estimates body fat based on height and weight. Your health care provider can help determine your BMI and help you achieve or maintain ahealthy weight. Get regular exercise Get regular exercise. This is one of the most important things you can do for your health. Most adults should: Exercise for at least 150 minutes each week. The exercise should increase your heart rate and make you sweat (moderate-intensity exercise). Do strengthening exercises at least twice a week. This is in addition to the moderate-intensity exercise. Spend less time sitting. Even light physical activity can be beneficial. Watch cholesterol and blood lipids Have your blood tested for lipids and cholesterol at 29 years of age, then havethis test every 5 years. You may need to have your cholesterol levels checked more often if: Your lipid or cholesterol levels are high. You are older than 29 years of age. You are at high risk for heart disease. What should I know about cancer screening? Many types of cancers can be detected early and may often be prevented. Depending on your health history and family history, you may need to have cancer screening at various ages. This may include screening for: Colorectal cancer. Prostate cancer. Skin cancer. Lung  cancer. What should I know about heart disease, diabetes, and high blood pressure? Blood pressure and heart disease High blood pressure causes heart disease and increases the risk of stroke. This is more likely to develop in people who have high blood pressure readings, are of African descent, or are overweight. Talk with your health care provider about your target blood pressure readings. Have your blood pressure checked: Every 3-5 years if you are 18-39 years of age. Every year if you are 40 years old or older. If you are between the ages of 65 and 75 and are a current or former smoker, ask your health care provider if you should have a one-time screening for abdominal aortic aneurysm (AAA). Diabetes Have regular diabetes screenings. This checks your fasting blood sugar level. Have the screening done: Once every three years after age 45 if you are at a normal weight and have a low risk for diabetes. More often and at a younger age if you are overweight or have a high risk for diabetes. What should I know about preventing infection? Hepatitis B If you have a higher risk for hepatitis B, you should be screened for this virus. Talk with your health care provider to find out if you are at risk forhepatitis B infection. Hepatitis C Blood testing is recommended for: Everyone born from 1945 through 1965. Anyone with known risk factors for hepatitis C. Sexually transmitted infections (STIs) You should be screened each year for STIs, including gonorrhea and chlamydia, if: You are sexually active and are younger than 29 years of age. You are older than 29 years of age   and your health care provider tells you that you are at risk for this type of infection. Your sexual activity has changed since you were last screened, and you are at increased risk for chlamydia or gonorrhea. Ask your health care provider if you are at risk. Ask your health care provider about whether you are at high risk for HIV.  Your health care provider may recommend a prescription medicine to help prevent HIV infection. If you choose to take medicine to prevent HIV, you should first get tested for HIV. You should then be tested every 3 months for as long as you are taking the medicine. Follow these instructions at home: Lifestyle Do not use any products that contain nicotine or tobacco, such as cigarettes, e-cigarettes, and chewing tobacco. If you need help quitting, ask your health care provider. Do not use street drugs. Do not share needles. Ask your health care provider for help if you need support or information about quitting drugs. Alcohol use Do not drink alcohol if your health care provider tells you not to drink. If you drink alcohol: Limit how much you have to 0-2 drinks a day. Be aware of how much alcohol is in your drink. In the [place] one drink equals one 12 oz bottle of beer (355 mL), one 5 oz glass of wine (148 mL), or one 1 oz glass of hard liquor (44 mL). General instructions Schedule regular health, dental, and eye exams. Stay current with your vaccines. Tell your health care provider if: You often feel depressed. You have ever been abused or do not feel safe at home. Summary Adopting a healthy lifestyle and getting preventive care are important in promoting health and wellness. Follow your health care provider's instructions about healthy diet, exercising, and getting tested or screened for diseases. Follow your health care provider's instructions on monitoring your cholesterol and blood pressure. This information is not intended to replace advice given to you by your health care provider. Make sure you discuss any questions you have with your healthcare provider. Document Revised: 09/02/2018 Document Reviewed: 09/02/2018 Elsevier Patient Education  2022 Elsevier Inc.  

## 2021-07-03 DIAGNOSIS — F209 Schizophrenia, unspecified: Secondary | ICD-10-CM | POA: Diagnosis not present

## 2022-03-14 ENCOUNTER — Encounter: Payer: Self-pay | Admitting: Emergency Medicine

## 2022-03-14 ENCOUNTER — Ambulatory Visit (INDEPENDENT_AMBULATORY_CARE_PROVIDER_SITE_OTHER): Payer: 59 | Admitting: Emergency Medicine

## 2022-03-14 VITALS — BP 128/84 | HR 75 | Temp 98.2°F | Ht 68.0 in | Wt 298.5 lb

## 2022-03-14 DIAGNOSIS — Z13228 Encounter for screening for other metabolic disorders: Secondary | ICD-10-CM

## 2022-03-14 DIAGNOSIS — Z114 Encounter for screening for human immunodeficiency virus [HIV]: Secondary | ICD-10-CM

## 2022-03-14 DIAGNOSIS — Z1329 Encounter for screening for other suspected endocrine disorder: Secondary | ICD-10-CM | POA: Diagnosis not present

## 2022-03-14 DIAGNOSIS — Z130 Encounter for screening for diseases of the blood and blood-forming organs and certain disorders involving the immune mechanism: Secondary | ICD-10-CM

## 2022-03-14 DIAGNOSIS — Z13220 Encounter for screening for lipoid disorders: Secondary | ICD-10-CM

## 2022-03-14 DIAGNOSIS — Z1159 Encounter for screening for other viral diseases: Secondary | ICD-10-CM | POA: Diagnosis not present

## 2022-03-14 DIAGNOSIS — Z8659 Personal history of other mental and behavioral disorders: Secondary | ICD-10-CM

## 2022-03-14 DIAGNOSIS — Z0000 Encounter for general adult medical examination without abnormal findings: Secondary | ICD-10-CM | POA: Diagnosis not present

## 2022-03-14 LAB — CBC WITH DIFFERENTIAL/PLATELET
Basophils Absolute: 0 10*3/uL (ref 0.0–0.1)
Basophils Relative: 0.8 % (ref 0.0–3.0)
Eosinophils Absolute: 0.3 10*3/uL (ref 0.0–0.7)
Eosinophils Relative: 5.5 % — ABNORMAL HIGH (ref 0.0–5.0)
HCT: 45.2 % (ref 39.0–52.0)
Hemoglobin: 14.8 g/dL (ref 13.0–17.0)
Lymphocytes Relative: 49.9 % — ABNORMAL HIGH (ref 12.0–46.0)
Lymphs Abs: 2.9 10*3/uL (ref 0.7–4.0)
MCHC: 32.7 g/dL (ref 30.0–36.0)
MCV: 84.2 fl (ref 78.0–100.0)
Monocytes Absolute: 0.3 10*3/uL (ref 0.1–1.0)
Monocytes Relative: 5.5 % (ref 3.0–12.0)
Neutro Abs: 2.2 10*3/uL (ref 1.4–7.7)
Neutrophils Relative %: 38.3 % — ABNORMAL LOW (ref 43.0–77.0)
Platelets: 243 10*3/uL (ref 150.0–400.0)
RBC: 5.37 Mil/uL (ref 4.22–5.81)
RDW: 14.9 % (ref 11.5–15.5)
WBC: 5.9 10*3/uL (ref 4.0–10.5)

## 2022-03-14 LAB — COMPREHENSIVE METABOLIC PANEL
ALT: 91 U/L — ABNORMAL HIGH (ref 0–53)
AST: 40 U/L — ABNORMAL HIGH (ref 0–37)
Albumin: 4.5 g/dL (ref 3.5–5.2)
Alkaline Phosphatase: 42 U/L (ref 39–117)
BUN: 8 mg/dL (ref 6–23)
CO2: 28 mEq/L (ref 19–32)
Calcium: 9.7 mg/dL (ref 8.4–10.5)
Chloride: 103 mEq/L (ref 96–112)
Creatinine, Ser: 0.91 mg/dL (ref 0.40–1.50)
GFR: 113.86 mL/min (ref >60.00)
Glucose, Bld: 109 mg/dL — ABNORMAL HIGH (ref 70–99)
Potassium: 4.1 mEq/L (ref 3.5–5.1)
Sodium: 137 mEq/L (ref 135–145)
Total Bilirubin: 0.4 mg/dL (ref 0.2–1.2)
Total Protein: 7.6 g/dL (ref 6.0–8.3)

## 2022-03-14 LAB — LIPID PANEL
Cholesterol: 175 mg/dL (ref 0–200)
HDL: 38.7 mg/dL — ABNORMAL LOW (ref >39.00)
LDL Cholesterol: 108 mg/dL — ABNORMAL HIGH (ref 0–99)
NonHDL: 136.23
Total CHOL/HDL Ratio: 5
Triglycerides: 139 mg/dL (ref 0.0–149.0)
VLDL: 27.8 mg/dL (ref 0.0–40.0)

## 2022-03-14 LAB — HEMOGLOBIN A1C: Hgb A1c MFr Bld: 6.5 % (ref 4.6–6.5)

## 2022-03-14 NOTE — Progress Notes (Signed)
Nathan Wilkerson 31 y.o.   Chief Complaint  Patient presents with   Annual Exam    HISTORY OF PRESENT ILLNESS: This is a 30 y.o. male here for annual exam Has no complaints or medical concerns today History of schizophrenia, on medications, sees psychiatrist on a regular basis.  Stable.  No concerns.  HPI   Prior to Admission medications   Medication Sig Start Date End Date Taking? Authorizing Provider  acetaminophen (TYLENOL 8 HOUR) 650 MG CR tablet Take 1 tablet (650 mg total) by mouth every 8 (eight) hours as needed for pain or fever. Patient not taking: Reported on 03/15/2021 02/27/21   Petrucelli, Lelon Mast R, PA-C  ARIPiprazole (ABILIFY) 20 MG tablet Take 20 mg by mouth daily.    [provider]  doxycycline (VIBRAMYCIN) 100 MG capsule Take 1 capsule (100 mg total) by mouth 2 (two) times daily. Patient not taking: Reported on 03/15/2021 02/27/21   Petrucelli, Lelon Mast R, PA-C  mirtazapine (REMERON) 15 MG tablet Take 15 mg by mouth at bedtime.    [provider]  naproxen (NAPROSYN) 500 MG tablet Take 1 tablet (500 mg total) by mouth 2 (two) times daily as needed for moderate pain. 02/27/21   Petrucelli, Samantha R, PA-C  ondansetron (ZOFRAN ODT) 4 MG disintegrating tablet Take 1 tablet (4 mg total) by mouth every 8 (eight) hours as needed for nausea or vomiting. 02/27/21   Petrucelli, Pleas Koch, PA-C  OVER THE COUNTER MEDICATION     [provider]    Allergies  Allergen Reactions   Amoxicillin     Patient Active Problem List   Diagnosis Date Noted   History of schizophrenia 04/12/2020    Past Medical History:  Diagnosis Date   Asthma    [employer] 03/27/2020    History reviewed. No pertinent surgical history.  Social History   Socioeconomic History   Marital status: Single    Spouse name: Not on file   Number of children: Not on file   Years of education: Not on file   Highest education level: Not on file  Occupational History   Not on file   Tobacco Use   Smoking status: Unknown   Smokeless tobacco: Never  Substance and Sexual Activity   Alcohol use: Not Currently   Drug use: Not Currently   Sexual activity: Not Currently  Other Topics Concern   Not on file  Social History Narrative   Not on file   Social Determinants of Health   Financial Resource Strain: Not on file  Food Insecurity: Not on file  Transportation Needs: Not on file  Physical Activity: Not on file  Stress: Not on file  Social Connections: Not on file  Intimate Partner Violence: Not on file    Family History  Problem Relation Age of Onset   Diabetes Father    Diabetes Maternal Grandmother    Diabetes Paternal Grandmother      Review of Systems  Constitutional: Negative.  Negative for chills and fever.  HENT: Negative.  Negative for congestion and sore throat.   Respiratory: Negative.  Negative for cough and shortness of breath.   Cardiovascular: Negative.  Negative for chest pain and palpitations.  Gastrointestinal:  Negative for abdominal pain, diarrhea, nausea and vomiting.  Genitourinary: Negative.  Negative for dysuria and hematuria.  Skin: Negative.  Negative for rash.  Neurological: Negative.  Negative for dizziness and headaches.  All other systems reviewed and are negative.  Today's Vitals   03/14/22 0813  BP:  128/84  Pulse: 75  Temp: 98.2 F (36.8 C)  TempSrc: Oral  SpO2: 98%  Weight: 298 lb 8 oz (135.4 kg)  Height: 5\' 8"  (1.727 m)   Body mass index is 45.39 kg/m.   Physical Exam Vitals reviewed.  Constitutional:      Appearance: Normal appearance.  HENT:     Head: Normocephalic.     Mouth/Throat:     Mouth: Mucous membranes are moist.     Pharynx: Oropharynx is clear.  Eyes:     Extraocular Movements: Extraocular movements intact.     Conjunctiva/sclera: Conjunctivae normal.     Pupils: Pupils are equal, round, and reactive to light.  Cardiovascular:     Rate and Rhythm: Normal rate and regular rhythm.      Pulses: Normal pulses.     Heart sounds: Normal heart sounds.  Pulmonary:     Effort: Pulmonary effort is normal.     Breath sounds: Normal breath sounds.  Musculoskeletal:     Cervical back: No tenderness.     Right lower leg: No edema.     Left lower leg: No edema.  Lymphadenopathy:     Cervical: No cervical adenopathy.  Skin:    General: Skin is warm and dry.     Capillary Refill: Capillary refill takes less than 2 seconds.  Neurological:     General: No focal deficit present.     Mental Status: He is alert and oriented to person, place, and time.  Psychiatric:        Mood and Affect: Mood normal.        Behavior: Behavior normal.      ASSESSMENT & PLAN: Problem List Items Addressed This Visit       Other   History of schizophrenia   Other Visit Diagnoses     Routine general medical examination at a health care facility    -  Primary   Need for hepatitis C screening test       Relevant Orders   Hepatitis C antibody screen   Screening for HIV (human immunodeficiency virus)       Relevant Orders   HIV antibody   Screening for deficiency anemia       Relevant Orders   CBC with Differential   Screening for lipoid disorders       Relevant Orders   Lipid panel   Screening for endocrine, metabolic and immunity disorder       Relevant Orders   Comprehensive metabolic panel   Hemoglobin A1c      Modifiable risk factors discussed with patient. Anticipatory guidance according to age provided. The following topics were also discussed: Social Determinants of Health Smoking.  Non-smoker Diet and nutrition and need to decrease amount of daily carbohydrate intake and daily calories Benefits of exercise Cancer family history review Vaccinations recommendations Cardiovascular risk assessment and need for blood work today Mental health including depression and anxiety Need to continue follow-up with psychiatrist for treatment of schizophrenia and medication  management Fall and accident prevention  Patient Instructions  Health Maintenance, Male Adopting a healthy lifestyle and getting preventive care are important in promoting health and wellness. Ask your health care provider about: The right schedule for you to have regular tests and exams. Things you can do on your own to prevent diseases and keep yourself healthy. What should I know about diet, weight, and exercise? Eat a healthy diet  Eat a diet that includes plenty of vegetables, fruits, low-fat dairy products,  and lean protein. Do not eat a lot of foods that are high in solid fats, added sugars, or sodium. Maintain a healthy weight Body mass index (BMI) is a measurement that can be used to identify possible weight problems. It estimates body fat based on height and weight. Your health care provider can help determine your BMI and help you achieve or maintain a healthy weight. Get regular exercise Get regular exercise. This is one of the most important things you can do for your health. Most adults should: Exercise for at least 150 minutes each week. The exercise should increase your heart rate and make you sweat (moderate-intensity exercise). Do strengthening exercises at least twice a week. This is in addition to the moderate-intensity exercise. Spend less time sitting. Even light physical activity can be beneficial. Watch cholesterol and blood lipids Have your blood tested for lipids and cholesterol at 30 years of age, then have this test every 5 years. You may need to have your cholesterol levels checked more often if: Your lipid or cholesterol levels are high. You are older than 30 years of age. You are at high risk for heart disease. What should I know about cancer screening? Many types of cancers can be detected early and may often be prevented. Depending on your health history and family history, you may need to have cancer screening at various ages. This may include screening  for: Colorectal cancer. Prostate cancer. Skin cancer. Lung cancer. What should I know about heart disease, diabetes, and high blood pressure? Blood pressure and heart disease High blood pressure causes heart disease and increases the risk of stroke. This is more likely to develop in people who have high blood pressure readings or are overweight. Talk with your health care provider about your target blood pressure readings. Have your blood pressure checked: Every 3-5 years if you are 78-12 years of age. Every year if you are 42 years old or older. If you are between the ages of 72 and 46 and are a current or former smoker, ask your health care provider if you should have a one-time screening for abdominal aortic aneurysm (AAA). Diabetes Have regular diabetes screenings. This checks your fasting blood sugar level. Have the screening done: Once every three years after age 57 if you are at a normal weight and have a low risk for diabetes. More often and at a younger age if you are overweight or have a high risk for diabetes. What should I know about preventing infection? Hepatitis B If you have a higher risk for hepatitis B, you should be screened for this virus. Talk with your health care provider to find out if you are at risk for hepatitis B infection. Hepatitis C Blood testing is recommended for: Everyone born from [birth year] through 1965. Anyone with known risk factors for hepatitis C. Sexually transmitted infections (STIs) You should be screened each year for STIs, including gonorrhea and chlamydia, if: You are sexually active and are younger than 31 years of age. You are older than 30 years of age and your health care provider tells you that you are at risk for this type of infection. Your sexual activity has changed since you were last screened, and you are at increased risk for chlamydia or gonorrhea. Ask your health care provider if you are at risk. Ask your health care provider about  whether you are at high risk for HIV. Your health care provider may recommend a prescription medicine to help prevent HIV infection. If  you choose to take medicine to prevent HIV, you should first get tested for HIV. You should then be tested every 3 months for as long as you are taking the medicine. Follow these instructions at home: Alcohol use Do not drink alcohol if your health care provider tells you not to drink. If you drink alcohol: Limit how much you have to 0-2 drinks a day. Know how much alcohol is in your drink. In the [place] one drink equals one 12 oz bottle of beer (355 mL), one 5 oz glass of wine (148 mL), or one 1 oz glass of hard liquor (44 mL). Lifestyle Do not use any products that contain nicotine or tobacco. These products include cigarettes, chewing tobacco, and vaping devices, such as e-cigarettes. If you need help quitting, ask your health care provider. Do not use street drugs. Do not share needles. Ask your health care provider for help if you need support or information about quitting drugs. General instructions Schedule regular health, dental, and eye exams. Stay current with your vaccines. Tell your health care provider if: You often feel depressed. You have ever been abused or do not feel safe at home. Summary Adopting a healthy lifestyle and getting preventive care are important in promoting health and wellness. Follow your health care provider's instructions about healthy diet, exercising, and getting tested or screened for diseases. Follow your health care provider's instructions on monitoring your cholesterol and blood pressure. This information is not intended to replace advice given to you by your health care provider. Make sure you discuss any questions you have with your health care provider. Document Revised: 01/29/2021 Document Reviewed: 01/29/2021 Elsevier Patient Education  2023 Elsevier Inc.     Edwina Barth, MD [facility]

## 2022-03-14 NOTE — Patient Instructions (Signed)
Health Maintenance, Male Adopting a healthy lifestyle and getting preventive care are important in promoting health and wellness. Ask your health care provider about: The right schedule for you to have regular tests and exams. Things you can do on your own to prevent diseases and keep yourself healthy. What should I know about diet, weight, and exercise? Eat a healthy diet  Eat a diet that includes plenty of vegetables, fruits, low-fat dairy products, and lean protein. Do not eat a lot of foods that are high in solid fats, added sugars, or sodium. Maintain a healthy weight Body mass index (BMI) is a measurement that can be used to identify possible weight problems. It estimates body fat based on height and weight. Your health care provider can help determine your BMI and help you achieve or maintain a healthy weight. Get regular exercise Get regular exercise. This is one of the most important things you can do for your health. Most adults should: Exercise for at least 150 minutes each week. The exercise should increase your heart rate and make you sweat (moderate-intensity exercise). Do strengthening exercises at least twice a week. This is in addition to the moderate-intensity exercise. Spend less time sitting. Even light physical activity can be beneficial. Watch cholesterol and blood lipids Have your blood tested for lipids and cholesterol at 30 years of age, then have this test every 5 years. You may need to have your cholesterol levels checked more often if: Your lipid or cholesterol levels are high. You are older than 30 years of age. You are at high risk for heart disease. What should I know about cancer screening? Many types of cancers can be detected early and may often be prevented. Depending on your health history and family history, you may need to have cancer screening at various ages. This may include screening for: Colorectal cancer. Prostate cancer. Skin cancer. Lung  cancer. What should I know about heart disease, diabetes, and high blood pressure? Blood pressure and heart disease High blood pressure causes heart disease and increases the risk of stroke. This is more likely to develop in people who have high blood pressure readings or are overweight. Talk with your health care provider about your target blood pressure readings. Have your blood pressure checked: Every 3-5 years if you are 18-39 years of age. Every year if you are 40 years old or older. If you are between the ages of 65 and 75 and are a current or former smoker, ask your health care provider if you should have a one-time screening for abdominal aortic aneurysm (AAA). Diabetes Have regular diabetes screenings. This checks your fasting blood sugar level. Have the screening done: Once every three years after age 45 if you are at a normal weight and have a low risk for diabetes. More often and at a younger age if you are overweight or have a high risk for diabetes. What should I know about preventing infection? Hepatitis B If you have a higher risk for hepatitis B, you should be screened for this virus. Talk with your health care provider to find out if you are at risk for hepatitis B infection. Hepatitis C Blood testing is recommended for: Everyone born from 1945 through 1965. Anyone with known risk factors for hepatitis C. Sexually transmitted infections (STIs) You should be screened each year for STIs, including gonorrhea and chlamydia, if: You are sexually active and are younger than 30 years of age. You are older than 30 years of age and your   health care provider tells you that you are at risk for this type of infection. Your sexual activity has changed since you were last screened, and you are at increased risk for chlamydia or gonorrhea. Ask your health care provider if you are at risk. Ask your health care provider about whether you are at high risk for HIV. Your health care provider  may recommend a prescription medicine to help prevent HIV infection. If you choose to take medicine to prevent HIV, you should first get tested for HIV. You should then be tested every 3 months for as long as you are taking the medicine. Follow these instructions at home: Alcohol use Do not drink alcohol if your health care provider tells you not to drink. If you drink alcohol: Limit how much you have to 0-2 drinks a day. Know how much alcohol is in your drink. In the [place] one drink equals one 12 oz bottle of beer (355 mL), one 5 oz glass of wine (148 mL), or one 1 oz glass of hard liquor (44 mL). Lifestyle Do not use any products that contain nicotine or tobacco. These products include cigarettes, chewing tobacco, and vaping devices, such as e-cigarettes. If you need help quitting, ask your health care provider. Do not use street drugs. Do not share needles. Ask your health care provider for help if you need support or information about quitting drugs. General instructions Schedule regular health, dental, and eye exams. Stay current with your vaccines. Tell your health care provider if: You often feel depressed. You have ever been abused or do not feel safe at home. Summary Adopting a healthy lifestyle and getting preventive care are important in promoting health and wellness. Follow your health care provider's instructions about healthy diet, exercising, and getting tested or screened for diseases. Follow your health care provider's instructions on monitoring your cholesterol and blood pressure. This information is not intended to replace advice given to you by your health care provider. Make sure you discuss any questions you have with your health care provider. Document Revised: 01/29/2021 Document Reviewed: 01/29/2021 Elsevier Patient Education  2023 Else[employer] Inc.  

## 2022-03-19 LAB — HEPATITIS C ANTIBODY: Hepatitis C Ab: NONREACTIVE

## 2022-03-19 LAB — HIV ANTIBODY (ROUTINE TESTING W REFLEX): HIV 1&2 Ab, 4th Generation: NONREACTIVE

## 2022-07-15 ENCOUNTER — Encounter (INDEPENDENT_AMBULATORY_CARE_PROVIDER_SITE_OTHER): Payer: Self-pay

## 2022-07-15 ENCOUNTER — Encounter: Payer: Self-pay | Admitting: Emergency Medicine

## 2022-07-15 ENCOUNTER — Ambulatory Visit: Payer: 59 | Admitting: Emergency Medicine

## 2022-07-15 DIAGNOSIS — R7303 Prediabetes: Secondary | ICD-10-CM

## 2022-07-15 MED ORDER — METFORMIN HCL 500 MG PO TABS: 500.0000 mg | ORAL_TABLET | Freq: Two times a day (BID) | ORAL | 3 refills | Status: DC

## 2022-07-15 NOTE — Patient Instructions (Signed)
Calorie Counting for Weight Loss Calories are units of energy. Your body needs a certain number of calories from food to keep going throughout the day. When you eat or drink more calories than your body needs, your body stores the extra calories mostly as fat. When you eat or drink fewer calories than your body needs, your body burns fat to get the energy it needs. Calorie counting means keeping track of how many calories you eat and drink each day. Calorie counting can be helpful if you need to lose weight. If you eat fewer calories than your body needs, you should lose weight. Ask your health care provider what a healthy weight is for you. For calorie counting to work, you will need to eat the right number of calories each day to lose a healthy amount of weight per week. A dietitian can help you figure out how many calories you need in a day and will suggest ways to reach your calorie goal. A healthy amount of weight to lose each week is usually 1-2 lb (0.5-0.9 kg). This usually means that your daily calorie intake should be reduced by 500-750 calories. Eating 1,200-1,500 calories a day can help most women lose weight. Eating 1,500-1,800 calories a day can help most men lose weight. What do I need to know about calorie counting? Work with your health care provider or dietitian to determine how many calories you should get each day. To meet your daily calorie goal, you will need to: Find out how many calories are in each food that you would like to eat. Try to do this before you eat. Decide how much of the food you plan to eat. Keep a food log. Do this by writing down what you ate and how many calories it had. To successfully lose weight, it is important to balance calorie counting with a healthy lifestyle that includes regular activity. Where do I find calorie information?  The number of calories in a food can be found on a Nutrition Facts label. If a food does not have a Nutrition Facts label, try  to look up the calories online or ask your dietitian for help. Remember that calories are listed per serving. If you choose to have more than one serving of a food, you will have to multiply the calories per serving by the number of servings you plan to eat. For example, the label on a package of bread might say that a serving size is 1 slice and that there are 90 calories in a serving. If you eat 1 slice, you will have eaten 90 calories. If you eat 2 slices, you will have eaten 180 calories. How do I keep a food log? After each time that you eat, record the following in your food log as soon as possible: What you ate. Be sure to include toppings, sauces, and other extras on the food. How much you ate. This can be measured in cups, ounces, or number of items. How many calories were in each food and drink. The total number of calories in the food you ate. Keep your food log near you, such as in a pocket-sized notebook or on an app or website on your mobile phone. Some programs will calculate calories for you and show you how many calories you have left to meet your daily goal. What are some portion-control tips? Know how many calories are in a serving. This will help you know how many servings you can have of a certain   food. Use a measuring cup to measure serving sizes. You could also try weighing out portions on a kitchen scale. With time, you will be able to estimate serving sizes for some foods. Take time to put servings of different foods on your favorite plates or in your favorite bowls and cups so you know what a serving looks like. Try not to eat straight from a food's packaging, such as from a bag or box. Eating straight from the package makes it hard to see how much you are eating and can lead to overeating. Put the amount you would like to eat in a cup or on a plate to make sure you are eating the right portion. Use smaller plates, glasses, and bowls for smaller portions and to prevent  overeating. Try not to multitask. For example, avoid watching TV or using your computer while eating. If it is time to eat, sit down at a table and enjoy your food. This will help you recognize when you are full. It will also help you be more mindful of what and how much you are eating. What are tips for following this plan? Reading food labels Check the calorie count compared with the serving size. The serving size may be smaller than what you are used to eating. Check the source of the calories. Try to choose foods that are high in protein, fiber, and vitamins, and low in saturated fat, trans fat, and sodium. Shopping Read nutrition labels while you shop. This will help you make healthy decisions about which foods to buy. Pay attention to nutrition labels for low-fat or fat-free foods. These foods sometimes have the same number of calories or more calories than the full-fat versions. They also often have added sugar, starch, or salt to make up for flavor that was removed with the fat. Make a grocery list of lower-calorie foods and stick to it. Cooking Try to cook your favorite foods in a healthier way. For example, try baking instead of frying. Use low-fat dairy products. Meal planning Use more fruits and vegetables. One-half of your plate should be fruits and vegetables. Include lean proteins, such as chicken, turkey, and fish. Lifestyle Each week, aim to do one of the following: 150 minutes of moderate exercise, such as walking. 75 minutes of vigorous exercise, such as running. General information Know how many calories are in the foods you eat most often. This will help you calculate calorie counts faster. Find a way of tracking calories that works for you. Get creative. Try different apps or programs if writing down calories does not work for you. What foods should I eat?  Eat nutritious foods. It is better to have a nutritious, high-calorie food, such as an avocado, than a food with  few nutrients, such as a bag of potato chips. Use your calories on foods and drinks that will fill you up and will not leave you hungry soon after eating. Examples of foods that fill you up are nuts and nut butters, vegetables, lean proteins, and high-fiber foods such as whole grains. High-fiber foods are foods with more than 5 g of fiber per serving. Pay attention to calories in drinks. Low-calorie drinks include water and unsweetened drinks. The items listed above may not be a complete list of foods and beverages you can eat. Contact a dietitian for more information. What foods should I limit? Limit foods or drinks that are not good sources of vitamins, minerals, or protein or that are high in unhealthy fats. These   include: Candy. Other sweets. Sodas, specialty coffee drinks, alcohol, and juice. The items listed above may not be a complete list of foods and beverages you should avoid. Contact a dietitian for more information. How do I count calories when eating out? Pay attention to portions. Often, portions are much larger when eating out. Try these tips to keep portions smaller: Consider sharing a meal instead of getting your own. If you get your own meal, eat only half of it. Before you start eating, ask for a container and put half of your meal into it. When available, consider ordering smaller portions from the menu instead of full portions. Pay attention to your food and drink choices. Knowing the way food is cooked and what is included with the meal can help you eat fewer calories. If calories are listed on the menu, choose the lower-calorie options. Choose dishes that include vegetables, fruits, whole grains, low-fat dairy products, and lean proteins. Choose items that are boiled, broiled, grilled, or steamed. Avoid items that are buttered, battered, fried, or served with cream sauce. Items labeled as crispy are usually fried, unless stated otherwise. Choose water, low-fat milk,  unsweetened iced tea, or other drinks without added sugar. If you want an alcoholic beverage, choose a lower-calorie option, such as a glass of wine or light beer. Ask for dressings, sauces, and syrups on the side. These are usually high in calories, so you should limit the amount you eat. If you want a salad, choose a garden salad and ask for grilled meats. Avoid extra toppings such as bacon, cheese, or fried items. Ask for the dressing on the side, or ask for olive oil and vinegar or lemon to use as dressing. Estimate how many servings of a food you are given. Knowing serving sizes will help you be aware of how much food you are eating at restaurants. Where to find more information Centers for Disease Control and Prevention: www.cdc.gov [place] Department [employer] Agriculture: myplate.gov Summary Calorie counting means keeping track of how many calories you eat and drink each day. If you eat fewer calories than your body needs, you should lose weight. A healthy amount of weight to lose per week is usually 1-2 lb (0.5-0.9 kg). This usually means reducing your daily calorie intake by 500-750 calories. The number of calories in a food can be found on a Nutrition Facts label. If a food does not have a Nutrition Facts label, try to look up the calories online or ask your dietitian for help. Use smaller plates, glasses, and bowls for smaller portions and to prevent overeating. Use your calories on foods and drinks that will fill you up and not leave you hungry shortly after a meal. This information is not intended to replace advice given to you by your health care provider. Make sure you discuss any questions you have with your health care provider. Document Revised: 10/21/2019 Document Reviewed: 10/21/2019 [employer] Patient Education  2023 [employer]  

## 2022-07-15 NOTE — Assessment & Plan Note (Signed)
Diet and nutrition discussed. Advised to decrease amount of daily carbohydrate intake and daily calories and increase amount of plant based protein in his diet. Refer to medical weight management We will start metformin 500 mg twice a day. Advised to exercise more.

## 2022-07-15 NOTE — Assessment & Plan Note (Signed)
Diet and nutrition discussed. Cardiovascular risks associated with diabetes discussed. We will start metformin 500 mg twice a day. Follow-up in 3 months.

## 2022-07-15 NOTE — Progress Notes (Signed)
Nathan Wilkerson 31 y.o.   Chief Complaint  Patient presents with  . concern about weight gain     HISTORY OF PRESENT ILLNESS: This is a 31 y.o. male concerned about his weight. Blood work last June showed hemoglobin A1c of 6.5 and mild increase of liver enzymes.  HPI   Prior to Admission medications   Medication Sig Start Date End Date Taking? Authorizing Provider  ARIPiprazole (ABILIFY) 20 MG tablet Take 20 mg by mouth daily.   Yes [provider]  OVER THE COUNTER MEDICATION    Yes [provider]  hydrOXYzine (ATARAX) 25 MG tablet Take 25 mg by mouth daily. 07/12/22   [provider]  mirtazapine (REMERON) 15 MG tablet Take 15 mg by mouth at bedtime.    [provider]    Allergies  Allergen Reactions  . Amoxicillin     Patient Active Problem List   Diagnosis Date Noted  . History of schizophrenia 04/12/2020    Past Medical History:  Diagnosis Date  . Asthma    [employer] 03/27/2020    No past surgical history on file.  Social History   Socioeconomic History  . Marital status: Single    Spouse name: Not on file  . Number of children: Not on file  . Years of education: Not on file  . Highest education level: Not on file  Occupational History  . Not on file  Tobacco Use  . Smoking status: Unknown  . Smokeless tobacco: Never  Substance and Sexual Activity  . Alcohol use: Not Currently  . Drug use: Not Currently  . Sexual activity: Not Currently  Other Topics Concern  . Not on file  Social History Narrative  . Not on file   Social Determinants of Health   Financial Resource Strain: Not on file  Food Insecurity: Not on file  Transportation Needs: Not on file  Physical Activity: Not on file  Stress: Not on file  Social Connections: Not on file  Intimate Partner Violence: Not on file    Family History  Problem Relation Age of Onset  . Diabetes Father   . Diabetes Maternal Grandmother   . Diabetes Paternal  Grandmother      Review of Systems  Constitutional: Negative.  Negative for chills and fever.  HENT: Negative.  Negative for congestion and sore throat.   Respiratory: Negative.  Negative for cough and shortness of breath.   Cardiovascular: Negative.  Negative for chest pain and palpitations.  Gastrointestinal:  Negative for abdominal pain, diarrhea, nausea and vomiting.  Skin: Negative.  Negative for rash.  Neurological: Negative.  Negative for dizziness and headaches.    Today's Vitals   07/15/22 0813  BP: 138/88  Pulse: 69  Temp: 98.9 F (37.2 C)  TempSrc: Oral  SpO2: 93%  Weight: (!) 308 lb 4 oz (139.8 kg)  Height: 5\' 8"  (1.727 m)   Body mass index is 46.87 kg/m. Wt Readings from Last 3 Encounters:  07/15/22 (!) 308 lb 4 oz (139.8 kg)  03/14/22 298 lb 8 oz (135.4 kg)  03/15/21 281 lb (127.5 kg)    Physical Exam Vitals reviewed.  Constitutional:      Appearance: Normal appearance. He is obese.  HENT:     Head: Normocephalic.  Eyes:     Extraocular Movements: Extraocular movements intact.     Pupils: Pupils are equal, round, and reactive to light.  Cardiovascular:     Rate and Rhythm: Normal rate and regular rhythm.  Pulses: Normal pulses.     Heart sounds: Normal heart sounds.  Pulmonary:     Effort: Pulmonary effort is normal.     Breath sounds: Normal breath sounds.  Musculoskeletal:     Cervical back: No tenderness.  Lymphadenopathy:     Cervical: No cervical adenopathy.  Skin:    General: Skin is warm and dry.     Capillary Refill: Capillary refill takes less than 2 seconds.  Neurological:     General: No focal deficit present.     Mental Status: He is alert and oriented to person, place, and time.  Psychiatric:        Mood and Affect: Mood normal.        Behavior: Behavior normal.   Lab Results  Component Value Date   HGBA1C 6.5 03/14/2022     ASSESSMENT & PLAN: A total of 45 minutes was spent with the patient and  counseling/coordination of care regarding preparing for this visit, review of most recent office visit note, review of most recent blood work results, diagnosis of diabetes and morbid obesity and cardiovascular risks associated with these conditions, education on nutrition, benefits of starting metformin.  Need for referral to medical weight management clinic, prognosis, documentation, need for follow-up  Problem List Items Addressed This Visit       Other   Borderline diabetes    Diet and nutrition discussed. Cardiovascular risks associated with diabetes discussed. We will start metformin 500 mg twice a day. Follow-up in 3 months.      Relevant Medications   metFORMIN (GLUCOPHAGE) 500 MG tablet   Morbid obesity ([place]) - Primary    Diet and nutrition discussed. Advised to decrease amount of daily carbohydrate intake and daily calories and increase amount of plant based protein in his diet. Refer to medical weight management We will start metformin 500 mg twice a day. Advised to exercise more.      Relevant Medications   metFORMIN (GLUCOPHAGE) 500 MG tablet   Other Relevant Orders   Amb Ref to Medical Weight Management   Patient Instructions  Calorie Counting for Weight Loss Calories are units of energy. Your body needs a certain number of calories from food to keep going throughout the day. When you eat or drink more calories than your body needs, your body stores the extra calories mostly as fat. When you eat or drink fewer calories than your body needs, your body burns fat to get the energy it needs. Calorie counting means keeping track of how many calories you eat and drink each day. Calorie counting can be helpful if you need to lose weight. If you eat fewer calories than your body needs, you should lose weight. Ask your health care provider what a healthy weight is for you. For calorie counting to work, you will need to eat the right number of calories each day to lose a healthy  amount of weight per week. A dietitian can help you figure out how many calories you need in a day and will suggest ways to reach your calorie goal. A healthy amount of weight to lose each week is usually 1-2 lb (0.5-0.9 kg). This usually means that your daily calorie intake should be reduced by 500-750 calories. Eating 1,200-1,500 calories a day can help most women lose weight. Eating 1,500-1,800 calories a day can help most men lose weight. What do I need to know about calorie counting? Work with your health care provider or dietitian to determine how many calories  you should get each day. To meet your daily calorie goal, you will need to: Find out how many calories are in each food that you would like to eat. Try to do this before you eat. Decide how much of the food you plan to eat. Keep a food log. Do this by writing down what you ate and how many calories it had. To successfully lose weight, it is important to balance calorie counting with a healthy lifestyle that includes regular activity. Where do I find calorie information?  The number of calories in a food can be found on a Nutrition Facts label. If a food does not have a Nutrition Facts label, try to look up the calories online or ask your dietitian for help. Remember that calories are listed per serving. If you choose to have more than one serving of a food, you will have to multiply the calories per serving by the number of servings you plan to eat. For example, the label on a package of bread might say that a serving size is 1 slice and that there are 90 calories in a serving. If you eat 1 slice, you will have eaten 90 calories. If you eat 2 slices, you will have eaten 180 calories. How do I keep a food log? After each time that you eat, record the following in your food log as soon as possible: What you ate. Be sure to include toppings, sauces, and other extras on the food. How much you ate. This can be measured in cups, ounces, or  number of items. How many calories were in each food and drink. The total number of calories in the food you ate. Keep your food log near you, such as in a pocket-sized notebook or on an app or website on your mobile phone. Some programs will calculate calories for you and show you how many calories you have left to meet your daily goal. What are some portion-control tips? Know how many calories are in a serving. This will help you know how many servings you can have of a certain food. Use a measuring cup to measure serving sizes. You could also try weighing out portions on a kitchen scale. With time, you will be able to estimate serving sizes for some foods. Take time to put servings of different foods on your favorite plates or in your favorite bowls and cups so you know what a serving looks like. Try not to eat straight from a food's packaging, such as from a bag or box. Eating straight from the package makes it hard to see how much you are eating and can lead to overeating. Put the amount you would like to eat in a cup or on a plate to make sure you are eating the right portion. Use smaller plates, glasses, and bowls for smaller portions and to prevent overeating. Try not to multitask. For example, avoid watching TV or using your computer while eating. If it is time to eat, sit down at a table and enjoy your food. This will help you recognize when you are full. It will also help you be more mindful of what and how much you are eating. What are tips for following this plan? Reading food labels Check the calorie count compared with the serving size. The serving size may be smaller than what you are used to eating. Check the source of the calories. Try to choose foods that are high in protein, fiber, and vitamins, and low in  saturated fat, trans fat, and sodium. Shopping Read nutrition labels while you shop. This will help you make healthy decisions about which foods to buy. Pay attention to  nutrition labels for low-fat or fat-free foods. These foods sometimes have the same number of calories or more calories than the full-fat versions. They also often have added sugar, starch, or salt to make up for flavor that was removed with the fat. Make a grocery list of lower-calorie foods and stick to it. Cooking Try to cook your favorite foods in a healthier way. For example, try baking instead of frying. Use low-fat dairy products. Meal planning Use more fruits and vegetables. One-half of your plate should be fruits and vegetables. Include lean proteins, such as chicken, [place], and fish. Lifestyle Each week, aim to do one of the following: 150 minutes of moderate exercise, such as walking. 75 minutes of vigorous exercise, such as running. General information Know how many calories are in the foods you eat most often. This will help you calculate calorie counts faster. Find a way of tracking calories that works for you. Get creative. Try different apps or programs if writing down calories does not work for you. What foods should I eat?  Eat nutritious foods. It is better to have a nutritious, high-calorie food, such as an avocado, than a food with few nutrients, such as a bag of potato chips. Use your calories on foods and drinks that will fill you up and will not leave you hungry soon after eating. Examples of foods that fill you up are nuts and nut butters, vegetables, lean proteins, and high-fiber foods such as whole grains. High-fiber foods are foods with more than 5 g of fiber per serving. Pay attention to calories in drinks. Low-calorie drinks include water and unsweetened drinks. The items listed above may not be a complete list of foods and beverages you can eat. Contact a dietitian for more information. What foods should I limit? Limit foods or drinks that are not good sources of vitamins, minerals, or protein or that are high in unhealthy fats. These include: Candy. Other  sweets. Sodas, specialty coffee drinks, alcohol, and juice. The items listed above may not be a complete list of foods and beverages you should avoid. Contact a dietitian for more information. How do I count calories when eating out? Pay attention to portions. Often, portions are much larger when eating out. Try these tips to keep portions smaller: Consider sharing a meal instead of getting your own. If you get your own meal, eat only half of it. Before you start eating, ask for a container and put half of your meal into it. When available, consider ordering smaller portions from the menu instead of full portions. Pay attention to your food and drink choices. Knowing the way food is cooked and what is included with the meal can help you eat fewer calories. If calories are listed on the menu, choose the lower-calorie options. Choose dishes that include vegetables, fruits, whole grains, low-fat dairy products, and lean proteins. Choose items that are boiled, broiled, grilled, or steamed. Avoid items that are buttered, battered, fried, or served with cream sauce. Items labeled as crispy are usually fried, unless stated otherwise. Choose water, low-fat milk, unsweetened iced tea, or other drinks without added sugar. If you want an alcoholic beverage, choose a lower-calorie option, such as a glass of wine or light beer. Ask for dressings, sauces, and syrups on the side. These are usually high in calories, so  you should limit the amount you eat. If you want a salad, choose a garden salad and ask for grilled meats. Avoid extra toppings such as bacon, cheese, or fried items. Ask for the dressing on the side, or ask for olive oil and vinegar or lemon to use as dressing. Estimate how many servings of a food you are given. Knowing serving sizes will help you be aware of how much food you are eating at restaurants. Where to find more information Centers for Disease Control and Prevention: FootballExhibition.com.br [place]  Department of Agriculture: WrestlingReporter.dk Summary Calorie counting means keeping track of how many calories you eat and drink each day. If you eat fewer calories than your body needs, you should lose weight. A healthy amount of weight to lose per week is usually 1-2 lb (0.5-0.9 kg). This usually means reducing your daily calorie intake by 500-750 calories. The number of calories in a food can be found on a Nutrition Facts label. If a food does not have a Nutrition Facts label, try to look up the calories online or ask your dietitian for help. Use smaller plates, glasses, and bowls for smaller portions and to prevent overeating. Use your calories on foods and drinks that will fill you up and not leave you hungry shortly after a meal. This information is not intended to replace advice given to you by your health care provider. Make sure you discuss any questions you have with your health care provider. Document Revised: 10/21/2019 Document Reviewed: 10/21/2019 Elsevier Patient Education  2023 Elsevier Inc.    Edwina Barth, MD [facility]

## 2022-10-15 ENCOUNTER — Ambulatory Visit: Payer: 59 | Admitting: Emergency Medicine

## 2023-01-10 ENCOUNTER — Telehealth: Payer: Self-pay | Admitting: Emergency Medicine

## 2023-01-10 ENCOUNTER — Telehealth (INDEPENDENT_AMBULATORY_CARE_PROVIDER_SITE_OTHER): Payer: 59 | Admitting: Nurse Practitioner

## 2023-01-10 DIAGNOSIS — J309 Allergic rhinitis, unspecified: Secondary | ICD-10-CM | POA: Insufficient documentation

## 2023-01-10 MED ORDER — FLUTICASONE PROPIONATE 50 MCG/ACT NA SUSP: 2.0000 | Freq: Every day | NASAL | 6 refills | Status: DC

## 2023-01-10 MED ORDER — LEVOCETIRIZINE DIHYDROCHLORIDE 5 MG PO TABS: 5.0000 mg | ORAL_TABLET | Freq: Every evening | ORAL | 2 refills | Status: DC

## 2023-01-10 NOTE — Progress Notes (Signed)
   Established Patient Office Visit  An audio/visual tele-health visit was completed today for this patient. I connected with  Carlisle Cater on 01/10/23 utilizing audio/visual technology and verified that I am speaking with the correct person using two identifiers. The patient was located at their parked vehicle, and I was located at the office of [facility] during the encounter. I discussed the limitations of evaluation and management by telemedicine. The patient expressed understanding and agreed to proceed.     Subjective   Patient ID: Nathan Wilkerson, male    DOB: [birth year]  Age: 32 y.o. MRN: [id number]  Chief Complaint  Patient presents with   Nasal Congestion    Nasal congestion for about 3 weeks, OTC zyrtec Claritin not helping      Has seasonal allergies chronically. Has been taking OTC claritin and zyrtec which normally controls his symptoms. However, these medications have been failing and his congestion is severe enough that it is affecting his ability to sleep.     Review of Systems  Constitutional:  Negative for chills and fever.  HENT:  Positive for congestion.   Respiratory:  Positive for wheezing. Negative for cough and shortness of breath.   Cardiovascular:  Negative for chest pain.      Objective:     There were no vitals taken for this visit. BP Readings from Last 3 Encounters:  07/15/22 138/88  03/14/22 128/84  03/15/21 116/60   Wt Readings from Last 3 Encounters:  07/15/22 (!) 308 lb 4 oz (139.8 kg)  03/14/22 298 lb 8 oz (135.4 kg)  03/15/21 281 lb (127.5 kg)      Physical Exam Comprehensive physical exam not completed today as office visit was conducted remotely.  Patient appears well over video.  Patient was alert and oriented, and appeared to have appropriate judgment.   No results found for any visits on 01/10/23.    The ASCVD Risk score (Arnett DK, et al., 2019) failed to calculate for the following reasons:   The  2019 ASCVD risk score is only valid for ages 105 to 73    Assessment & Plan:   Problem List Items Addressed This Visit       Respiratory   Allergic rhinitis - Primary    Chronic Has failed over-the-counter Claritin and Zyrtec Would like to try Xyzal 1 tablet by mouth at bedtime.  Prescription sent to pharmacy. Will also recommend Flonase nasal spray daily. Patient educated to follow-up if symptoms still persist or worsen for in person evaluation      Relevant Medications   fluticasone (FLONASE) 50 MCG/ACT nasal spray   levocetirizine (XYZAL) 5 MG tablet    Return if symptoms worsen or fail to improve.    Elenore Paddy, NP

## 2023-01-10 NOTE — Assessment & Plan Note (Signed)
Chronic Has failed over-the-counter Claritin and Zyrtec Would like to try Xyzal 1 tablet by mouth at bedtime.  Prescription sent to pharmacy. Will also recommend Flonase nasal spray daily. Patient educated to follow-up if symptoms still persist or worsen for in person evaluation

## 2023-01-10 NOTE — Telephone Encounter (Signed)
Pt called having allergy issues along with congestion stating over the counter meds is not working for him inform about pt ov he stated he really can't miss work please call pt with update

## 2023-01-10 NOTE — Telephone Encounter (Signed)
Called patient and scheduled him to see Maralyn Sago NP today for a virtual visit

## 2023-01-31 ENCOUNTER — Ambulatory Visit: Payer: 59 | Admitting: Family Medicine

## 2023-02-03 IMAGING — CR DG CHEST 2V
2 series · 2 of 2 positions shown · non-contrast
Comparison: None.

CLINICAL DATA: Fever.

EXAM:
CHEST - 2 VIEW

[w chest pa]
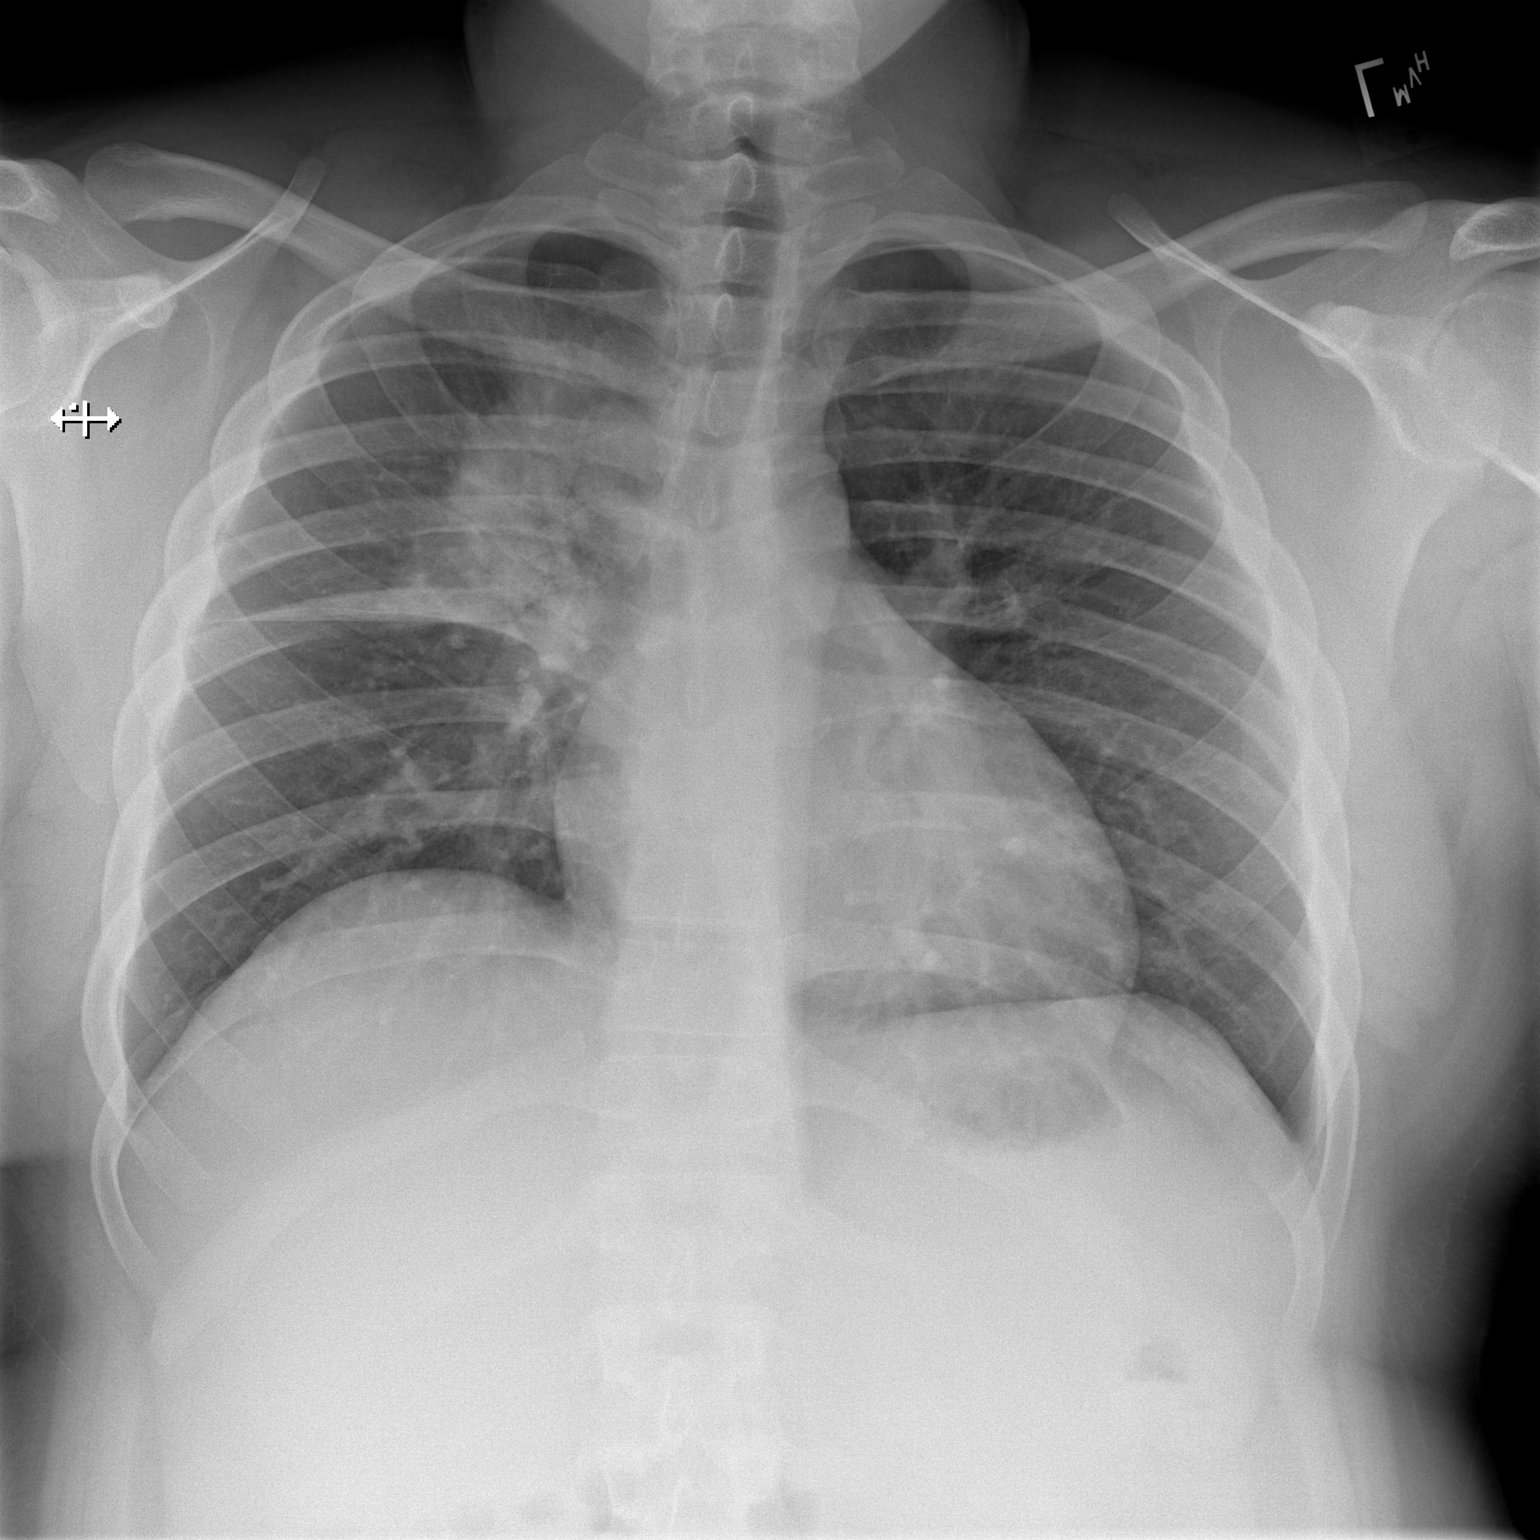

[w chest lat]
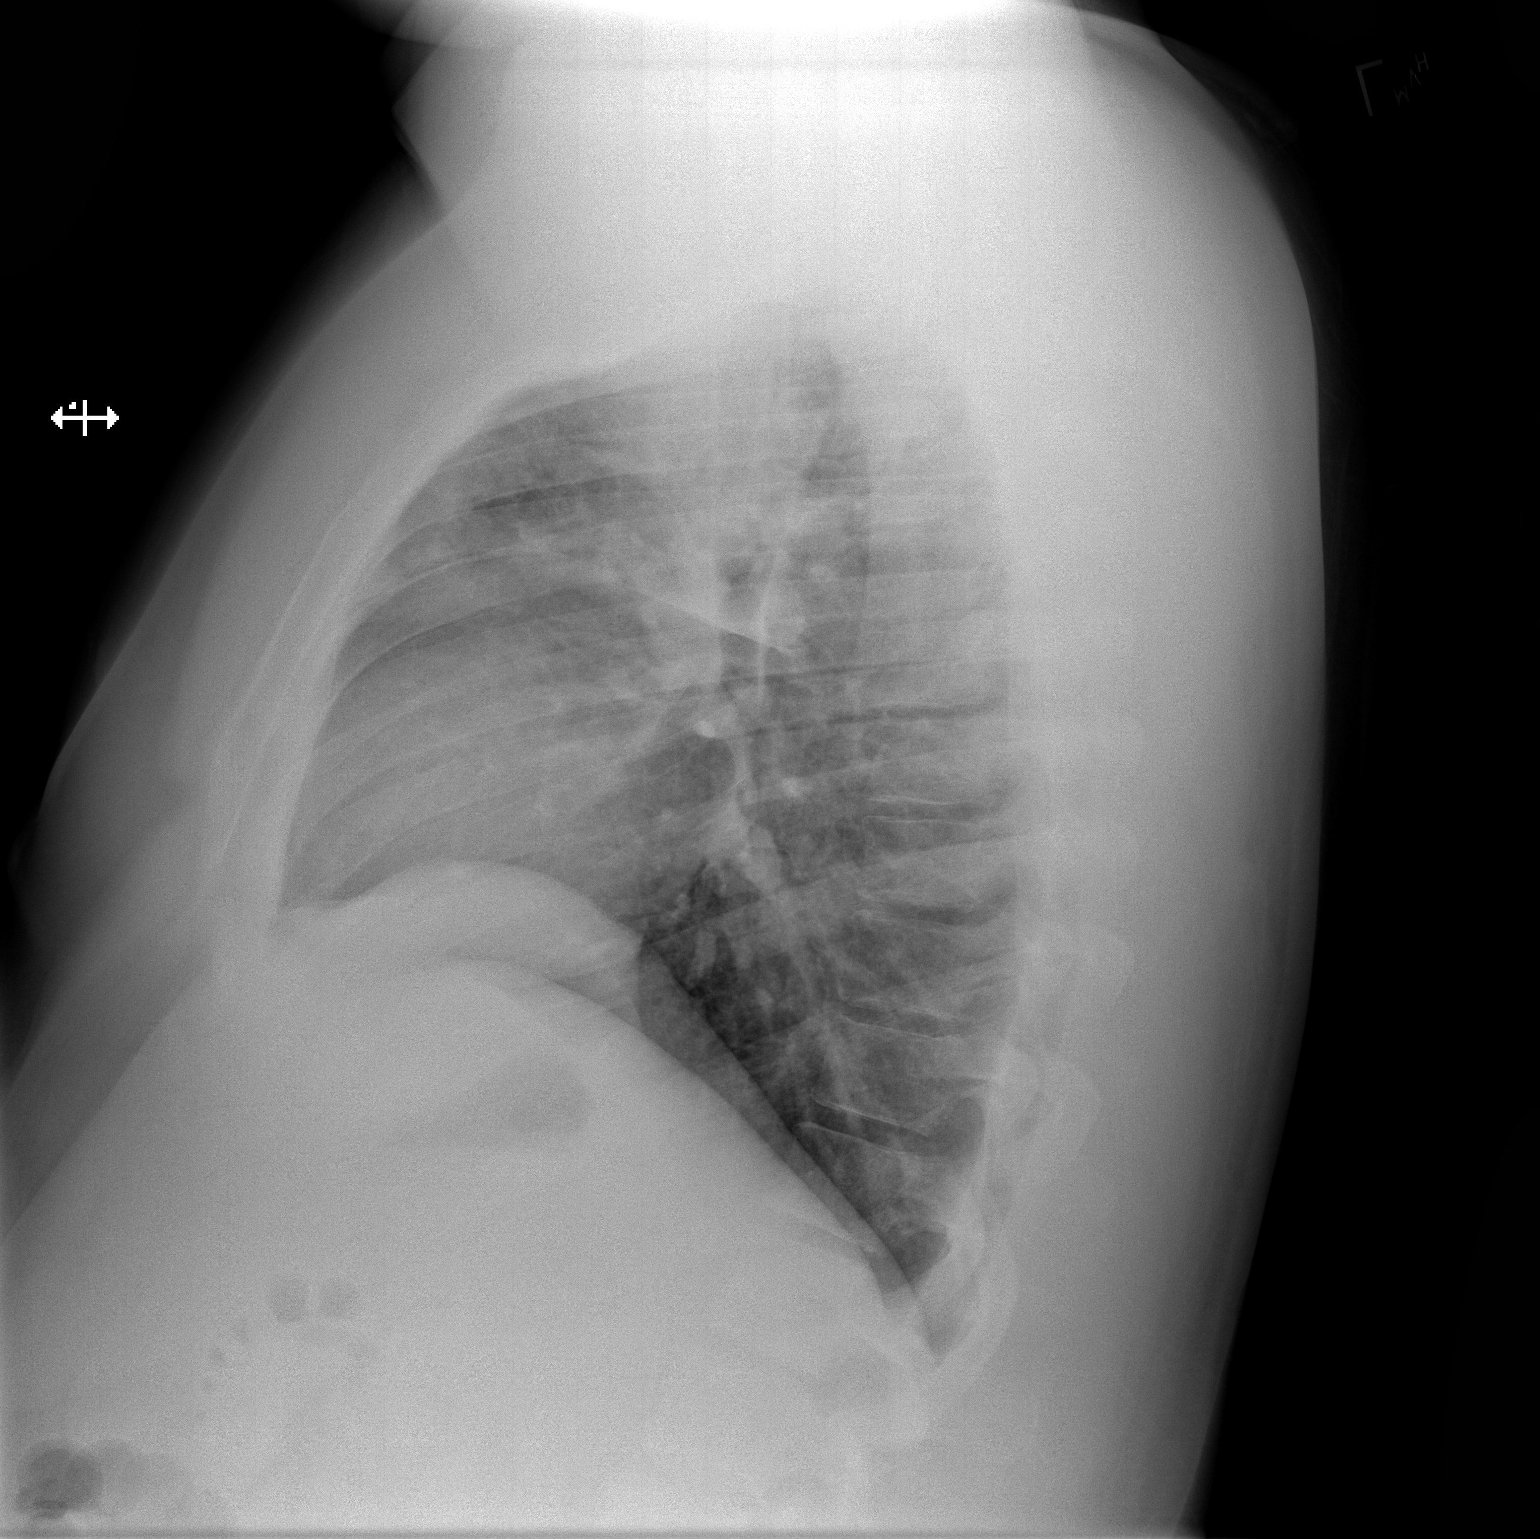

[2 of 2 positions shown; findings below may reference images not displayed]

FINDINGS: Airspace opacities in the medial aspect of the right upper lung. No
visible pleural effusions or pneumothorax. Portions of the
mediastinum are obscured on the right. Non obscured
cardiomediastinal silhouette is within normal limits. No acute
osseous abnormality.
IMPRESSION: Airspace opacities in the medial aspect of the right upper lung,
compatible with pneumonia. Recommend imaging follow-up to resolution
to exclude underlying mass.

## 2023-02-04 ENCOUNTER — Encounter: Payer: Self-pay | Admitting: Emergency Medicine

## 2023-02-04 ENCOUNTER — Ambulatory Visit: Payer: 59 | Admitting: Emergency Medicine

## 2023-02-04 DIAGNOSIS — R29818 Other symptoms and signs involving the nervous system: Secondary | ICD-10-CM | POA: Insufficient documentation

## 2023-02-04 DIAGNOSIS — R030 Elevated blood-pressure reading, without diagnosis of hypertension: Secondary | ICD-10-CM | POA: Diagnosis not present

## 2023-02-04 DIAGNOSIS — R399 Unspecified symptoms and signs involving the genitourinary system: Secondary | ICD-10-CM | POA: Diagnosis not present

## 2023-02-04 LAB — POCT URINALYSIS DIPSTICK
Bilirubin, UA: NEGATIVE
Blood, UA: NEGATIVE
Glucose, UA: POSITIVE — AB
Ketones, UA: NEGATIVE
Leukocytes, UA: NEGATIVE
Nitrite, UA: NEGATIVE
Protein, UA: POSITIVE — AB
Spec Grav, UA: 1.025 (ref 1.010–1.025)
Urobilinogen, UA: 0.2 E.U./dL
pH, UA: 6 (ref 5.0–8.0)

## 2023-02-04 NOTE — Patient Instructions (Signed)
Calorie Counting for Weight Loss Calories are units of energy. Your body needs a certain number of calories from food to keep going throughout the day. When you eat or drink more calories than your body needs, your body stores the extra calories mostly as fat. When you eat or drink fewer calories than your body needs, your body burns fat to get the energy it needs. Calorie counting means keeping track of how many calories you eat and drink each day. Calorie counting can be helpful if you need to lose weight. If you eat fewer calories than your body needs, you should lose weight. Ask your health care provider what a healthy weight is for you. For calorie counting to work, you will need to eat the right number of calories each day to lose a healthy amount of weight per week. A dietitian can help you figure out how many calories you need in a day and will suggest ways to reach your calorie goal. A healthy amount of weight to lose each week is usually 1-2 lb (0.5-0.9 kg). This usually means that your daily calorie intake should be reduced by 500-750 calories. Eating 1,200-1,500 calories a day can help most women lose weight. Eating 1,500-1,800 calories a day can help most men lose weight. What do I need to know about calorie counting? Work with your health care provider or dietitian to determine how many calories you should get each day. To meet your daily calorie goal, you will need to: Find out how many calories are in each food that you would like to eat. Try to do this before you eat. Decide how much of the food you plan to eat. Keep a food log. Do this by writing down what you ate and how many calories it had. To successfully lose weight, it is important to balance calorie counting with a healthy lifestyle that includes regular activity. Where do I find calorie information?  The number of calories in a food can be found on a Nutrition Facts label. If a food does not have a Nutrition Facts label, try  to look up the calories online or ask your dietitian for help. Remember that calories are listed per serving. If you choose to have more than one serving of a food, you will have to multiply the calories per serving by the number of servings you plan to eat. For example, the label on a package of bread might say that a serving size is 1 slice and that there are 90 calories in a serving. If you eat 1 slice, you will have eaten 90 calories. If you eat 2 slices, you will have eaten 180 calories. How do I keep a food log? After each time that you eat, record the following in your food log as soon as possible: What you ate. Be sure to include toppings, sauces, and other extras on the food. How much you ate. This can be measured in cups, ounces, or number of items. How many calories were in each food and drink. The total number of calories in the food you ate. Keep your food log near you, such as in a pocket-sized notebook or on an app or website on your mobile phone. Some programs will calculate calories for you and show you how many calories you have left to meet your daily goal. What are some portion-control tips? Know how many calories are in a serving. This will help you know how many servings you can have of a certain   food. Use a measuring cup to measure serving sizes. You could also try weighing out portions on a kitchen scale. With time, you will be able to estimate serving sizes for some foods. Take time to put servings of different foods on your favorite plates or in your favorite bowls and cups so you know what a serving looks like. Try not to eat straight from a food's packaging, such as from a bag or box. Eating straight from the package makes it hard to see how much you are eating and can lead to overeating. Put the amount you would like to eat in a cup or on a plate to make sure you are eating the right portion. Use smaller plates, glasses, and bowls for smaller portions and to prevent  overeating. Try not to multitask. For example, avoid watching TV or using your computer while eating. If it is time to eat, sit down at a table and enjoy your food. This will help you recognize when you are full. It will also help you be more mindful of what and how much you are eating. What are tips for following this plan? Reading food labels Check the calorie count compared with the serving size. The serving size may be smaller than what you are used to eating. Check the source of the calories. Try to choose foods that are high in protein, fiber, and vitamins, and low in saturated fat, trans fat, and sodium. Shopping Read nutrition labels while you shop. This will help you make healthy decisions about which foods to buy. Pay attention to nutrition labels for low-fat or fat-free foods. These foods sometimes have the same number of calories or more calories than the full-fat versions. They also often have added sugar, starch, or salt to make up for flavor that was removed with the fat. Make a grocery list of lower-calorie foods and stick to it. Cooking Try to cook your favorite foods in a healthier way. For example, try baking instead of frying. Use low-fat dairy products. Meal planning Use more fruits and vegetables. One-half of your plate should be fruits and vegetables. Include lean proteins, such as chicken, turkey, and fish. Lifestyle Each week, aim to do one of the following: 150 minutes of moderate exercise, such as walking. 75 minutes of vigorous exercise, such as running. General information Know how many calories are in the foods you eat most often. This will help you calculate calorie counts faster. Find a way of tracking calories that works for you. Get creative. Try different apps or programs if writing down calories does not work for you. What foods should I eat?  Eat nutritious foods. It is better to have a nutritious, high-calorie food, such as an avocado, than a food with  few nutrients, such as a bag of potato chips. Use your calories on foods and drinks that will fill you up and will not leave you hungry soon after eating. Examples of foods that fill you up are nuts and nut butters, vegetables, lean proteins, and high-fiber foods such as whole grains. High-fiber foods are foods with more than 5 g of fiber per serving. Pay attention to calories in drinks. Low-calorie drinks include water and unsweetened drinks. The items listed above may not be a complete list of foods and beverages you can eat. Contact a dietitian for more information. What foods should I limit? Limit foods or drinks that are not good sources of vitamins, minerals, or protein or that are high in unhealthy fats. These   include: Candy. Other sweets. Sodas, specialty coffee drinks, alcohol, and juice. The items listed above may not be a complete list of foods and beverages you should avoid. Contact a dietitian for more information. How do I count calories when eating out? Pay attention to portions. Often, portions are much larger when eating out. Try these tips to keep portions smaller: Consider sharing a meal instead of getting your own. If you get your own meal, eat only half of it. Before you start eating, ask for a container and put half of your meal into it. When available, consider ordering smaller portions from the menu instead of full portions. Pay attention to your food and drink choices. Knowing the way food is cooked and what is included with the meal can help you eat fewer calories. If calories are listed on the menu, choose the lower-calorie options. Choose dishes that include vegetables, fruits, whole grains, low-fat dairy products, and lean proteins. Choose items that are boiled, broiled, grilled, or steamed. Avoid items that are buttered, battered, fried, or served with cream sauce. Items labeled as crispy are usually fried, unless stated otherwise. Choose water, low-fat milk,  unsweetened iced tea, or other drinks without added sugar. If you want an alcoholic beverage, choose a lower-calorie option, such as a glass of wine or light beer. Ask for dressings, sauces, and syrups on the side. These are usually high in calories, so you should limit the amount you eat. If you want a salad, choose a garden salad and ask for grilled meats. Avoid extra toppings such as bacon, cheese, or fried items. Ask for the dressing on the side, or ask for olive oil and vinegar or lemon to use as dressing. Estimate how many servings of a food you are given. Knowing serving sizes will help you be aware of how much food you are eating at restaurants. Where to find more information Centers for Disease Control and Prevention: www.cdc.gov [place] Department [employer] Agriculture: myplate.gov Summary Calorie counting means keeping track of how many calories you eat and drink each day. If you eat fewer calories than your body needs, you should lose weight. A healthy amount of weight to lose per week is usually 1-2 lb (0.5-0.9 kg). This usually means reducing your daily calorie intake by 500-750 calories. The number of calories in a food can be found on a Nutrition Facts label. If a food does not have a Nutrition Facts label, try to look up the calories online or ask your dietitian for help. Use smaller plates, glasses, and bowls for smaller portions and to prevent overeating. Use your calories on foods and drinks that will fill you up and not leave you hungry shortly after a meal. This information is not intended to replace advice given to you by your health care provider. Make sure you discuss any questions you have with your health care provider. Document Revised: 10/21/2019 Document Reviewed: 10/21/2019 [employer] Patient Education  2023 [employer]  

## 2023-02-04 NOTE — Assessment & Plan Note (Signed)
Advised to monitor blood pressure readings at home daily for the next several weeks and keep a log.  Advised to contact the office if numbers persistently elevated. Diet and nutrition discussed Benefits of exercise discussed

## 2023-02-04 NOTE — Assessment & Plan Note (Signed)
Diet and nutrition discussed. Benefits of exercise discussed. Advised to decrease amount of daily carbohydrate intake and daily calories and increase amount of plant-based protein in his diet 

## 2023-02-04 NOTE — Progress Notes (Signed)
Nathan Wilkerson 31 y.o.   Chief Complaint  Patient presents with   Urinary Tract Infection    Patient states that he did a DOT physical and his urine showed traces of blood, currently no symptoms.    Referral    Patient wants a referral for sleep study, patient snores, Had DOT physical and needs a sleep study done in order for his physical to be passed     HISTORY OF PRESENT ILLNESS: This is a 32 y.o. male recently had DOT exam and 2 things were concerning for examining physician #1 traces of blood in the urine.  Asymptomatic #2 possibility of sleep apnea No other complaints or medical concerns today.  Urinary Tract Infection  Pertinent negatives include no chills, nausea or vomiting.     Prior to Admission medications   Medication Sig Start Date End Date Taking? Authorizing Provider  amitriptyline (ELAVIL) 25 MG tablet Take 25 mg by mouth at bedtime.   Yes [provider]  ARIPiprazole (ABILIFY) 20 MG tablet Take 20 mg by mouth daily.   Yes [provider]  fluticasone (FLONASE) 50 MCG/ACT nasal spray Place 2 sprays into both nostrils daily. 01/10/23  Yes Elenore Paddy, NP  levocetirizine (XYZAL) 5 MG tablet Take 1 tablet (5 mg total) by mouth every evening. 01/10/23  Yes Elenore Paddy, NP  metFORMIN (GLUCOPHAGE) 500 MG tablet Take 1 tablet (500 mg total) by mouth 2 (two) times daily with a meal. 07/15/22  Yes Kallon Caylor, Eilleen Kempf, MD  mirtazapine (REMERON) 15 MG tablet Take 15 mg by mouth at bedtime. Patient not taking: Reported on 02/04/2023    [provider]  OVER THE COUNTER MEDICATION     [provider]    Allergies  Allergen Reactions   Amoxicillin     Patient Active Problem List   Diagnosis Date Noted   Suspected sleep apnea 02/04/2023   Allergic rhinitis 01/10/2023   Borderline diabetes 07/15/2022   Morbid obesity (HCC) 07/15/2022   History of schizophrenia 04/12/2020    Past Medical History:  Diagnosis Date   Asthma     [employer] 03/27/2020    No past surgical history on file.  Social History   Socioeconomic History   Marital status: Single    Spouse name: Not on file   Number of children: Not on file   Years of education: Not on file   Highest education level: 12th grade  Occupational History   Not on file  Tobacco Use   Smoking status: Unknown   Smokeless tobacco: Never  Substance and Sexual Activity   Alcohol use: Not Currently   Drug use: Not Currently   Sexual activity: Not Currently  Other Topics Concern   Not on file  Social History Narrative   Not on file   Social Determinants of Health   Financial Resource Strain: Medium Risk (01/31/2023)   Overall Financial Resource Strain (CARDIA)    Difficulty of Paying Living Expenses: Somewhat hard  Food Insecurity: Food Insecurity Present (01/31/2023)   Hunger Vital Sign    Worried About Running Out of Food in the Last Year: Never true    Ran Out of Food in the Last Year: Sometimes true  Transportation Needs: No Transportation Needs (01/31/2023)   PRAPARE - Administrator, Civil Service (Medical): No    Lack of Transportation (Non-Medical): No  Physical Activity: Unknown (01/31/2023)   Exercise Vital Sign    Days of Exercise per Week: 0 days  Minutes of Exercise per Session: Not on file  Stress: No Stress Concern Present (01/31/2023)   [employer] of Occupational Health - Occupational Stress Questionnaire    Feeling of Stress : Not at all  Social Connections: Socially Isolated (01/31/2023)   Social Connection and Isolation Panel [NHANES]    Frequency of Communication with Friends and Family: More than three times a week    Frequency of Social Gatherings with Friends and Family: More than three times a week    Attends Religious Services: Never    Database administrator or Organizations: No    Attends Engineer, structural: Not on file    Marital Status: Never married  Catering manager Violence: Not on file     Family History  Problem Relation Age of Onset   Diabetes Father    Diabetes Maternal Grandmother    Diabetes Paternal Grandmother      Review of Systems  Constitutional: Negative.  Negative for chills and fever.       Snores loudly  HENT: Negative.  Negative for congestion and sore throat.   Respiratory: Negative.  Negative for cough and shortness of breath.   Cardiovascular: Negative.  Negative for chest pain and palpitations.  Gastrointestinal:  Negative for abdominal pain, diarrhea, nausea and vomiting.  Genitourinary: Negative.   Skin: Negative.  Negative for rash.  Neurological: Negative.  Negative for dizziness and headaches.    Vitals:   02/04/23 0944  BP: (!) 160/84  Pulse: 98  Temp: 98.3 F (36.8 C)  SpO2: 98%    Physical Exam Vitals reviewed.  Constitutional:      Appearance: He is obese.  HENT:     Head: Normocephalic.  Eyes:     Extraocular Movements: Extraocular movements intact.  Cardiovascular:     Rate and Rhythm: Normal rate.  Pulmonary:     Effort: Pulmonary effort is normal.  Skin:    General: Skin is warm and dry.  Neurological:     Mental Status: He is alert and oriented to person, place, and time.  Psychiatric:        Mood and Affect: Mood normal.        Behavior: Behavior normal.      ASSESSMENT & PLAN: A total of 44 minutes was spent with the patient and counseling/coordination of care regarding preparing for this visit, review of most recent office visit notes, review of most recent blood work results, education on nutrition, possibility of sleep apnea and need for sleep apnea studies and evaluation, prognosis, documentation, and need for follow-up.  Problem List Items Addressed This Visit       Other   Morbid obesity (HCC) - Primary    Diet and nutrition discussed Benefits of exercise discussed Advised to decrease amount of daily carbohydrate intake and daily calories and increase amount of plant-based protein in his diet       Suspected sleep apnea   Relevant Orders   Ambulatory referral to Sleep Studies   Elevated blood pressure reading in office without diagnosis of hypertension    Advised to monitor blood pressure readings at home daily for the next several weeks and keep a log.  Advised to contact the office if numbers persistently elevated. Diet and nutrition discussed Benefits of exercise discussed      Other Visit Diagnoses     UTI symptoms       Relevant Orders   POCT Urinalysis Dipstick (Completed)      Patient Instructions  Calorie  Counting for Weight Loss Calories are units of energy. Your body needs a certain number of calories from food to keep going throughout the day. When you eat or drink more calories than your body needs, your body stores the extra calories mostly as fat. When you eat or drink fewer calories than your body needs, your body burns fat to get the energy it needs. Calorie counting means keeping track of how many calories you eat and drink each day. Calorie counting can be helpful if you need to lose weight. If you eat fewer calories than your body needs, you should lose weight. Ask your health care provider what a healthy weight is for you. For calorie counting to work, you will need to eat the right number of calories each day to lose a healthy amount of weight per week. A dietitian can help you figure out how many calories you need in a day and will suggest ways to reach your calorie goal. A healthy amount of weight to lose each week is usually 1-2 lb (0.5-0.9 kg). This usually means that your daily calorie intake should be reduced by 500-750 calories. Eating 1,200-1,500 calories a day can help most women lose weight. Eating 1,500-1,800 calories a day can help most men lose weight. What do I need to know about calorie counting? Work with your health care provider or dietitian to determine how many calories you should get each day. To meet your daily calorie goal, you will  need to: Find out how many calories are in each food that you would like to eat. Try to do this before you eat. Decide how much of the food you plan to eat. Keep a food log. Do this by writing down what you ate and how many calories it had. To successfully lose weight, it is important to balance calorie counting with a healthy lifestyle that includes regular activity. Where do I find calorie information?  The number of calories in a food can be found on a Nutrition Facts label. If a food does not have a Nutrition Facts label, try to look up the calories online or ask your dietitian for help. Remember that calories are listed per serving. If you choose to have more than one serving of a food, you will have to multiply the calories per serving by the number of servings you plan to eat. For example, the label on a package of bread might say that a serving size is 1 slice and that there are 90 calories in a serving. If you eat 1 slice, you will have eaten 90 calories. If you eat 2 slices, you will have eaten 180 calories. How do I keep a food log? After each time that you eat, record the following in your food log as soon as possible: What you ate. Be sure to include toppings, sauces, and other extras on the food. How much you ate. This can be measured in cups, ounces, or number of items. How many calories were in each food and drink. The total number of calories in the food you ate. Keep your food log near you, such as in a pocket-sized notebook or on an app or website on your mobile phone. Some programs will calculate calories for you and show you how many calories you have left to meet your daily goal. What are some portion-control tips? Know how many calories are in a serving. This will help you know how many servings you can have of a certain food.  Use a measuring cup to measure serving sizes. You could also try weighing out portions on a kitchen scale. With time, you will be able to estimate  serving sizes for some foods. Take time to put servings of different foods on your favorite plates or in your favorite bowls and cups so you know what a serving looks like. Try not to eat straight from a food's packaging, such as from a bag or box. Eating straight from the package makes it hard to see how much you are eating and can lead to overeating. Put the amount you would like to eat in a cup or on a plate to make sure you are eating the right portion. Use smaller plates, glasses, and bowls for smaller portions and to prevent overeating. Try not to multitask. For example, avoid watching TV or using your computer while eating. If it is time to eat, sit down at a table and enjoy your food. This will help you recognize when you are full. It will also help you be more mindful of what and how much you are eating. What are tips for following this plan? Reading food labels Check the calorie count compared with the serving size. The serving size may be smaller than what you are used to eating. Check the source of the calories. Try to choose foods that are high in protein, fiber, and vitamins, and low in saturated fat, trans fat, and sodium. Shopping Read nutrition labels while you shop. This will help you make healthy decisions about which foods to buy. Pay attention to nutrition labels for low-fat or fat-free foods. These foods sometimes have the same number of calories or more calories than the full-fat versions. They also often have added sugar, starch, or salt to make up for flavor that was removed with the fat. Make a grocery list of lower-calorie foods and stick to it. Cooking Try to cook your favorite foods in a healthier way. For example, try baking instead of frying. Use low-fat dairy products. Meal planning Use more fruits and vegetables. One-half of your plate should be fruits and vegetables. Include lean proteins, such as chicken, [place], and fish. Lifestyle Each week, aim to do one of  the following: 150 minutes of moderate exercise, such as walking. 75 minutes of vigorous exercise, such as running. General information Know how many calories are in the foods you eat most often. This will help you calculate calorie counts faster. Find a way of tracking calories that works for you. Get creative. Try different apps or programs if writing down calories does not work for you. What foods should I eat?  Eat nutritious foods. It is better to have a nutritious, high-calorie food, such as an avocado, than a food with few nutrients, such as a bag of potato chips. Use your calories on foods and drinks that will fill you up and will not leave you hungry soon after eating. Examples of foods that fill you up are nuts and nut butters, vegetables, lean proteins, and high-fiber foods such as whole grains. High-fiber foods are foods with more than 5 g of fiber per serving. Pay attention to calories in drinks. Low-calorie drinks include water and unsweetened drinks. The items listed above may not be a complete list of foods and beverages you can eat. Contact a dietitian for more information. What foods should I limit? Limit foods or drinks that are not good sources of vitamins, minerals, or protein or that are high in unhealthy fats. These include:  Candy. Other sweets. Sodas, specialty coffee drinks, alcohol, and juice. The items listed above may not be a complete list of foods and beverages you should avoid. Contact a dietitian for more information. How do I count calories when eating out? Pay attention to portions. Often, portions are much larger when eating out. Try these tips to keep portions smaller: Consider sharing a meal instead of getting your own. If you get your own meal, eat only half of it. Before you start eating, ask for a container and put half of your meal into it. When available, consider ordering smaller portions from the menu instead of full portions. Pay attention to your  food and drink choices. Knowing the way food is cooked and what is included with the meal can help you eat fewer calories. If calories are listed on the menu, choose the lower-calorie options. Choose dishes that include vegetables, fruits, whole grains, low-fat dairy products, and lean proteins. Choose items that are boiled, broiled, grilled, or steamed. Avoid items that are buttered, battered, fried, or served with cream sauce. Items labeled as crispy are usually fried, unless stated otherwise. Choose water, low-fat milk, unsweetened iced tea, or other drinks without added sugar. If you want an alcoholic beverage, choose a lower-calorie option, such as a glass of wine or light beer. Ask for dressings, sauces, and syrups on the side. These are usually high in calories, so you should limit the amount you eat. If you want a salad, choose a garden salad and ask for grilled meats. Avoid extra toppings such as bacon, cheese, or fried items. Ask for the dressing on the side, or ask for olive oil and vinegar or lemon to use as dressing. Estimate how many servings of a food you are given. Knowing serving sizes will help you be aware of how much food you are eating at restaurants. Where to find more information Centers for Disease Control and Prevention: FootballExhibition.com.br U.[place]. Department of Agriculture: WrestlingReporter.dk Summary Calorie counting means keeping track of how many calories you eat and drink each day. If you eat fewer calories than your body needs, you should lose weight. A healthy amount of weight to lose per week is usually 1-2 lb (0.5-0.9 kg). This usually means reducing your daily calorie intake by 500-750 calories. The number of calories in a food can be found on a Nutrition Facts label. If a food does not have a Nutrition Facts label, try to look up the calories online or ask your dietitian for help. Use smaller plates, glasses, and bowls for smaller portions and to prevent overeating. Use your calories  on foods and drinks that will fill you up and not leave you hungry shortly after a meal. This information is not intended to replace advice given to you by your health care provider. Make sure you discuss any questions you have with your health care provider. Document Revised: 10/21/2019 Document Reviewed: 10/21/2019 Elsevier Patient Education  2023 [employer].      Edwina Barth, MD [facility]

## 2023-03-17 ENCOUNTER — Encounter: Payer: Self-pay | Admitting: Neurology

## 2023-03-17 ENCOUNTER — Encounter: Payer: 59 | Admitting: Emergency Medicine

## 2023-03-17 ENCOUNTER — Ambulatory Visit (INDEPENDENT_AMBULATORY_CARE_PROVIDER_SITE_OTHER): Payer: 59 | Admitting: Neurology

## 2023-03-17 VITALS — BP 136/88 | HR 87 | Ht 68.0 in | Wt 287.4 lb

## 2023-03-17 DIAGNOSIS — R519 Headache, unspecified: Secondary | ICD-10-CM

## 2023-03-17 DIAGNOSIS — G44019 Episodic cluster headache, not intractable: Secondary | ICD-10-CM | POA: Diagnosis not present

## 2023-03-17 DIAGNOSIS — K219 Gastro-esophageal reflux disease without esophagitis: Secondary | ICD-10-CM | POA: Insufficient documentation

## 2023-03-17 DIAGNOSIS — R0683 Snoring: Secondary | ICD-10-CM | POA: Insufficient documentation

## 2023-03-17 DIAGNOSIS — R0681 Apnea, not elsewhere classified: Secondary | ICD-10-CM

## 2023-03-17 DIAGNOSIS — Z6841 Body Mass Index (BMI) 40.0 and over, adult: Secondary | ICD-10-CM

## 2023-03-17 DIAGNOSIS — E661 Drug-induced obesity: Secondary | ICD-10-CM

## 2023-03-17 NOTE — Progress Notes (Signed)
SLEEP MEDICINE CLINIC    Provider:  Melvyn Novas, MD  Primary Care Physician:  Georgina Quint, MD [address] [place] [address]     Referring Provider: Edwina Barth [place], [place] [address] [place],  [place] [address]          Chief Complaint according to patient   Patient presents with:     New Patient (Initial Visit)     NEW patient Referral for sleep consult. Pt states he snores and wakes up multiple times throughout the night, wakes up feeling tired, does not feel rested.       HISTORY OF PRESENT ILLNESS:  Nathan Wilkerson is a 32 y.o. male patient who is seen upon referral on 03/17/2023 from Dr Alvy Bimler  for a Sleep study.  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  Chief concern according to patient :  " non restorative sleep ".  Nathan Wilkerson , seen on 03/17/23 has never had a previous sleep study.    Sleep relevant medical history:  Snoring, Nocturia 3-4, sleeping 6-8 hours. Asthma, allergic.  No ENT surgery no trauma , no whiplash.  In treatment for schizophrenia.Catatonia.    Family medical /sleep history: No  other family member on CPAP with OSA. father  was a snorer. He died of  unknown causes.  Social history:  Patient is working as a Location manager and lives in a household with 2 persons/  Pets are present, dogs. The patient currently works from 7 - 7 PM. Tobacco use- vaping .  ETOH use ; beer on a occasion, 4 a week. Caffeine intake in form of Coffee( /) Soda( /) Tea ( /)  quit  drinking energy drinks 14 days ago.  Exercise in form of / NA.        Sleep habits are as follows: The patient's dinner time is between 7-8 PM. The patient goes to bed at 10.30 PM  to a cool, quiet and dark- and continues to sleep for less than 6  hours, wakes for many bathroom breaks, the first time at 1 AM.  Often wakes up at 3 AM - needs to go to work by 5.30.  The preferred sleep position is left, with the support  of 2 pillows.  GERD. Dreams are reportedly frequent/vivid.  He is sometimes woken by headaches , sharp stabbing.   The patient wakes up spontaneously , 5.30  AM is the usual rise time. He reports not feeling refreshed or restored in AM, with symptoms such as dry mouth, morning headaches, and residual fatigue.  Naps are taken as often as possible frequently, lasting from 30 to 60 minutes and are  refreshing . Review of Systems: Out of a complete 14 system review, the patient complains of only the following symptoms, and all other reviewed systems are negative.:  Fatigue, sleepiness , snoring, fragmented sleep, Insomnia with early waking up, sleep headaches. , RLS.  How likely are you to doze in the following situations: 0 = not likely, 1 = slight chance, 2 = moderate chance, 3 = high chance   Sitting and Reading? Watching Television? Sitting inactive in a public place (theater or meeting)? As a passenger in a car for an hour without a break? Lying down in the afternoon when circumstances permit? Sitting and talking to someone? Sitting quietly after lunch without alcohol? In a car, while stopped for a few minutes in traffic?   Total =  7/ 24 points   FSS endorsed at 37/ 63 points.   Social History   Socioeconomic History   Marital status: Single    Spouse name: Not on file   Number of children: Not on file   Years of education: Not on file   Highest education level: 12th grade  Occupational History   Not on file  Tobacco Use   Smoking status: Unknown   Smokeless tobacco: Never  Substance and Sexual Activity   Alcohol use: Not Currently   Drug use: Not Currently   Sexual activity: Not Currently  Other Topics Concern   Not on file  Social History Narrative   Not on file   Social Determinants of Health   Financial Resource Strain: Medium Risk (01/31/2023)   Overall Financial Resource Strain (CARDIA)    Difficulty of Paying Living Expenses: Somewhat hard  Food Insecurity:  Food Insecurity Present (01/31/2023)   Hunger Vital Sign    Worried About Running Out of Food in the Last Year: Never true    Ran Out of Food in the Last Year: Sometimes true  Transportation Needs: No Transportation Needs (01/31/2023)   PRAPARE - Administrator, Civil Service (Medical): No    Lack of Transportation (Non-Medical): No  Physical Activity: Unknown (01/31/2023)   Exercise Vital Sign    Days of Exercise per Week: 0 days    Minutes of Exercise per Session: Not on file  Stress: No Stress Concern Present (01/31/2023)   [employer] of Occupational Health - Occupational Stress Questionnaire    Feeling of Stress : Not at all  Social Connections: Socially Isolated (01/31/2023)   Social Connection and Isolation Panel [NHANES]    Frequency of Communication with Friends and Family: More than three times a week    Frequency of Social Gatherings with Friends and Family: More than three times a week    Attends Religious Services: Never    Database administrator or Organizations: No    Attends Engineer, structural: Not on file    Marital Status: Never married    Family History  Problem Relation Age of Onset   Diabetes Father    Diabetes Maternal Grandmother    Diabetes Paternal Grandmother     Past Medical History:  Diagnosis Date   Asthma    [employer] 03/27/2020    History reviewed. No pertinent surgical history.   Current Outpatient Medications on File Prior to Visit  Medication Sig Dispense Refill   amitriptyline (ELAVIL) 25 MG tablet Take 25 mg by mouth at bedtime.     ARIPiprazole (ABILIFY) 20 MG tablet Take 20 mg by mouth daily.     metFORMIN (GLUCOPHAGE) 500 MG tablet Take 1 tablet (500 mg total) by mouth 2 (two) times daily with a meal. 180 tablet 3   OVER THE COUNTER MEDICATION      levocetirizine (XYZAL) 5 MG tablet Take 1 tablet (5 mg total) by mouth every evening. (Patient not taking: Reported on 03/17/2023) 30 tablet 2   No current  facility-administered medications on file prior to visit.    Allergies  Allergen Reactions   Amoxicillin      DIAGNOSTIC DATA (LABS, IMAGING, TESTING) - I reviewed patient records, labs, notes, testing and imaging myself where available.  Lab Results  Component Value Date   WBC 5.9 03/14/2022   HGB 14.8 03/14/2022   HCT 45.2 03/14/2022   MCV 84.2 03/14/2022   PLT 243.0 03/14/2022  Component Value Date/Time   NA 137 03/14/2022 0832   NA 141 04/12/2020 1615   K 4.1 03/14/2022 0832   CL 103 03/14/2022 0832   CO2 28 03/14/2022 0832   GLUCOSE 109 (H) 03/14/2022 0832   BUN 8 03/14/2022 0832   BUN 9 04/12/2020 1615   CREATININE 0.91 03/14/2022 0832   CALCIUM 9.7 03/14/2022 0832   PROT 7.6 03/14/2022 0832   PROT 7.5 04/12/2020 1615   ALBUMIN 4.5 03/14/2022 0832   ALBUMIN 4.7 04/12/2020 1615   AST 40 (H) 03/14/2022 0832   ALT 91 (H) 03/14/2022 0832   ALKPHOS 42 03/14/2022 0832   BILITOT 0.4 03/14/2022 0832   BILITOT 0.3 04/12/2020 1615   GFRNONAA >60 02/26/2021 1910   GFRAA 123 04/12/2020 1615   Lab Results  Component Value Date   CHOL 175 03/14/2022   HDL 38.70 (L) 03/14/2022   LDLCALC 108 (H) 03/14/2022   TRIG 139.0 03/14/2022   CHOLHDL 5 03/14/2022   Lab Results  Component Value Date   HGBA1C 6.5 03/14/2022   No results found for: "VITAMINB12" No results found for: "TSH"  PHYSICAL EXAM:  Today's Vitals   03/17/23 1441  BP: 136/88  Pulse: 87  Weight: 287 lb 6.4 oz (130.4 kg)  Height: 5\' 8"  (1.727 m)   Body mass index is 43.7 kg/m.   Wt Readings from Last 3 Encounters:  03/17/23 287 lb 6.4 oz (130.4 kg)  02/04/23 (!) 306 lb 8 oz (139 kg)  07/15/22 (!) 308 lb 4 oz (139.8 kg)     Ht Readings from Last 3 Encounters:  03/17/23 5\' 8"  (1.727 m)  02/04/23 5\' 8"  (1.727 m)  07/15/22 5\' 8"  (1.727 m)      General: The patient is awake, alert and appears not in acute distress. The patient is well groomed. Head: Normocephalic, atraumatic. Neck is  supple.  Mallampati 2, lateral crowding, elongated uvula , red mucosa.  neck circumference:18 inches . Nasal airflow  patent.  Retrognathia is not seen.  Dental status: poor, gaps , no retainers.  Cardiovascular:  Regular rate and cardiac rhythm by pulse,  without distended neck veins. Respiratory: Lungs are clear to auscultation.  Skin:  Without evidence of ankle edema, or rash. Trunk: The patient's posture is erect.   NEUROLOGIC EXAM: The patient is awake and alert, oriented to place and time.   Memory subjective described as intact.  Attention span & concentration ability appears normal.  Speech is fluent,  without  dysarthria, dysphonia or aphasia.  Mood and affect are appropriate.   Cranial nerves: no loss of smell or taste reported  Pupils are equal and briskly reactive to light. Funduscopic exam def.  Extraocular movements in vertical and horizontal planes were intact and without nystagmus. No Diplopia. Visual fields by finger perimetry are intact. Hearing was intact to soft voice and finger rubbing.    Facial sensation intact to fine touch.  Facial motor strength is symmetric and tongue and uvula move midline.  Neck ROM : rotation, tilt and flexion extension were normal for age and shoulder shrug was symmetrical.    Motor exam:  Symmetric bulk, tone and ROM.   Normal tone without cog- wheeling, symmetric grip strength .   Sensory:  Fine touch, pinprick and vibration were tested  and  normal.  Proprioception tested in the upper extremities was normal.   Coordination: Rapid alternating movements in the fingers/hands were of normal speed.  The Finger-to-nose maneuver was intact without evidence of ataxia, dysmetria  or tremor.   Gait and station: Patient could rise unassisted from a seated position, walked without assistive device.  Stance is of normal width/ base and the patient turned with 3 steps.  Toe and heel walk were deferred.  Deep tendon reflexes: in the  upper and  lower extremities are symmetric and intact.  Babinski response was deferred.    ASSESSMENT AND PLAN 32 y.o. 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  Risk factors for OSA are BMI, Neck  and upper airway.   2) sleep related headaches   3) Nocturia,   4) short sleeper, undesired early AM arousals from sleep.    HST will be ordered  for DOT confirmation and work release.   I plan to follow up either personally or through our NP within 3 months.   I would like to thank Georgina Quint, MD  for allowing me to meet with and to take care of this pleasant patient.   CC: I will share my notes with PCP and diane Excell Selt[facility] , [facility], [place].  After spending a total time of  35  minutes face to face and additional time for physical and neurologic examination, review of laboratory studies,  personal review of imaging studies, reports and results of other testing and review of referral information / records as far as provided in visit,   Electronically signed by: Melvyn Novas, MD 03/17/2023 3:27 PM  [facility] Board certified by The [employer] of Sleep Medicine and Diplomate of the [employer] of Sleep Medicine. Board certified In Neurology through the ABPN, Fellow of the [employer] of Neurology.

## 2023-03-18 ENCOUNTER — Telehealth: Payer: Self-pay | Admitting: Neurology

## 2023-03-18 NOTE — Telephone Encounter (Signed)
MAIL OUT - [employer] no auth req -   Patient is on the schedule for 03/19/23.

## 2023-03-19 ENCOUNTER — Ambulatory Visit: Payer: 59 | Admitting: Neurology

## 2023-03-19 DIAGNOSIS — K219 Gastro-esophageal reflux disease without esophagitis: Secondary | ICD-10-CM

## 2023-03-19 DIAGNOSIS — G44019 Episodic cluster headache, not intractable: Secondary | ICD-10-CM

## 2023-03-19 DIAGNOSIS — R0683 Snoring: Secondary | ICD-10-CM

## 2023-03-19 DIAGNOSIS — Z6841 Body Mass Index (BMI) 40.0 and over, adult: Secondary | ICD-10-CM

## 2023-03-19 DIAGNOSIS — R519 Headache, unspecified: Secondary | ICD-10-CM

## 2023-03-19 DIAGNOSIS — G4733 Obstructive sleep apnea (adult) (pediatric): Secondary | ICD-10-CM

## 2023-03-19 DIAGNOSIS — E661 Drug-induced obesity: Secondary | ICD-10-CM

## 2023-03-19 DIAGNOSIS — R030 Elevated blood-pressure reading, without diagnosis of hypertension: Secondary | ICD-10-CM

## 2023-03-26 ENCOUNTER — Ambulatory Visit: Payer: Managed Care, Other (non HMO) | Admitting: Emergency Medicine

## 2023-03-26 ENCOUNTER — Encounter: Payer: Self-pay | Admitting: Emergency Medicine

## 2023-03-26 VITALS — BP 138/92 | HR 92 | Temp 98.3°F | Ht 68.0 in | Wt 281.0 lb

## 2023-03-26 DIAGNOSIS — R7303 Prediabetes: Secondary | ICD-10-CM

## 2023-03-26 DIAGNOSIS — Z0000 Encounter for general adult medical examination without abnormal findings: Secondary | ICD-10-CM

## 2023-03-26 DIAGNOSIS — Z13228 Encounter for screening for other metabolic disorders: Secondary | ICD-10-CM

## 2023-03-26 DIAGNOSIS — Z13220 Encounter for screening for lipoid disorders: Secondary | ICD-10-CM | POA: Diagnosis not present

## 2023-03-26 DIAGNOSIS — Z7985 Long-term (current) use of injectable non-insulin antidiabetic drugs: Secondary | ICD-10-CM

## 2023-03-26 DIAGNOSIS — R29818 Other symptoms and signs involving the nervous system: Secondary | ICD-10-CM | POA: Diagnosis not present

## 2023-03-26 DIAGNOSIS — Z0001 Encounter for general adult medical examination with abnormal findings: Secondary | ICD-10-CM

## 2023-03-26 DIAGNOSIS — Z130 Encounter for screening for diseases of the blood and blood-forming organs and certain disorders involving the immune mechanism: Secondary | ICD-10-CM

## 2023-03-26 DIAGNOSIS — Z1329 Encounter for screening for other suspected endocrine disorder: Secondary | ICD-10-CM | POA: Diagnosis not present

## 2023-03-26 LAB — CBC WITH DIFFERENTIAL/PLATELET
Basophils Absolute: 0 10*3/uL (ref 0.0–0.1)
Basophils Relative: 0.7 % (ref 0.0–3.0)
Eosinophils Absolute: 0.2 10*3/uL (ref 0.0–0.7)
Eosinophils Relative: 2.7 % (ref 0.0–5.0)
HCT: 43.8 % (ref 39.0–52.0)
Hemoglobin: 14.6 g/dL (ref 13.0–17.0)
Lymphocytes Relative: 48.1 % — ABNORMAL HIGH (ref 12.0–46.0)
Lymphs Abs: 3 10*3/uL (ref 0.7–4.0)
MCHC: 33.4 g/dL (ref 30.0–36.0)
MCV: 83 fl (ref 78.0–100.0)
Monocytes Absolute: 0.5 10*3/uL (ref 0.1–1.0)
Monocytes Relative: 7.2 % (ref 3.0–12.0)
Neutro Abs: 2.6 10*3/uL (ref 1.4–7.7)
Neutrophils Relative %: 41.3 % — ABNORMAL LOW (ref 43.0–77.0)
Platelets: 244 10*3/uL (ref 150.0–400.0)
RBC: 5.27 Mil/uL (ref 4.22–5.81)
RDW: 14.4 % (ref 11.5–15.5)
WBC: 6.3 10*3/uL (ref 4.0–10.5)

## 2023-03-26 LAB — COMPREHENSIVE METABOLIC PANEL
ALT: 55 U/L — ABNORMAL HIGH (ref 0–53)
AST: 32 U/L (ref 0–37)
Albumin: 4.5 g/dL (ref 3.5–5.2)
Alkaline Phosphatase: 71 U/L (ref 39–117)
BUN: 12 mg/dL (ref 6–23)
CO2: 21 mEq/L (ref 19–32)
Calcium: 9.7 mg/dL (ref 8.4–10.5)
Chloride: 93 mEq/L — ABNORMAL LOW (ref 96–112)
Creatinine, Ser: 0.96 mg/dL (ref 0.40–1.50)
GFR: 106.01 mL/min (ref >60.00)
Glucose, Bld: 368 mg/dL — ABNORMAL HIGH (ref 70–99)
Potassium: 4.2 mEq/L (ref 3.5–5.1)
Sodium: 128 mEq/L — ABNORMAL LOW (ref 135–145)
Total Bilirubin: 0.4 mg/dL (ref 0.2–1.2)
Total Protein: 7.7 g/dL (ref 6.0–8.3)

## 2023-03-26 LAB — HEMOGLOBIN A1C: Hgb A1c MFr Bld: 13.2 % — ABNORMAL HIGH (ref 4.6–6.5)

## 2023-03-26 LAB — LDL CHOLESTEROL, DIRECT: Direct LDL: 80 mg/dL

## 2023-03-26 LAB — LIPID PANEL
Cholesterol: 215 mg/dL — ABNORMAL HIGH (ref 0–200)
HDL: 28.1 mg/dL — ABNORMAL LOW (ref >39.00)
Total CHOL/HDL Ratio: 8
Triglycerides: 1101 mg/dL — ABNORMAL HIGH (ref 0.0–149.0)

## 2023-03-26 MED ORDER — TIRZEPATIDE 5 MG/0.5ML ~~LOC~~ SOAJ: 5.0000 mg | SUBCUTANEOUS | 3 refills | Status: DC

## 2023-03-26 NOTE — Progress Notes (Signed)
Nathan Wilkerson 32 y.o.   Chief Complaint  Patient presents with   Annual Exam    Pt here for physical. Pt also want his tonsils looked at he states they dont hurt and wakes up with cotton mouth every morning. He believe it it coming from the sleep apnea but just wants it looked at. And wants to change from metformin to ozempic if possible      HISTORY OF PRESENT ILLNESS: This is a 32 y.o. male here for annual exam. Last hemoglobin A1c was at 6.5.  Diabetic range Was started on metformin.  Requesting to be switched to Ozempic. Recently had sleep study.  Report not yet available.  HPI   Prior to Admission medications   Medication Sig Start Date End Date Taking? Authorizing Provider  amitriptyline (ELAVIL) 25 MG tablet Take 25 mg by mouth at bedtime.   Yes [provider]  ARIPiprazole (ABILIFY) 20 MG tablet Take 20 mg by mouth daily.   Yes [provider]  metFORMIN (GLUCOPHAGE) 500 MG tablet Take 1 tablet (500 mg total) by mouth 2 (two) times daily with a meal. 07/15/22  Yes Darrol Brandenburg, Eilleen Kempf, MD  levocetirizine (XYZAL) 5 MG tablet Take 1 tablet (5 mg total) by mouth every evening. 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  Social History   Socioeconomic History   Marital status: Single    Spouse name: Not on file   Number of children: Not on file   Years of education: Not on file   Highest education level: 12th grade  Occupational History   Not on file  Tobacco Use   Smoking status: Unknown   Smokeless tobacco: Never  Substance and Sexual Activity   Alcohol use: Not Currently   Drug use: Not Currently   Sexual activity: Not Currently  Other Topics Concern   Not on file  Social History Narrative   Not on file   Social Determinants of Health   Financial Resource Strain: Medium Risk (01/31/2023)   Overall Financial Resource Strain (CARDIA)    Difficulty of Paying Living Expenses: Somewhat hard  Food Insecurity: Food Insecurity Present (01/31/2023)   Hunger Vital Sign    Worried About Running Out of Food in the Last Year: Never true    Ran Out of Food in the Last Year: Sometimes true  Transportation Needs: No Transportation Needs (01/31/2023)   PRAPARE - Administrator, Civil Service (Medical): No    Lack of Transportation (Non-Medical): No  Physical Activity: Unknown (01/31/2023)   Exercise Vital Sign    Days of Exercise per Week:  0 days    Minutes of Exercise per Session: Not on file  Stress: No Stress Concern Present (01/31/2023)   [employer] of Occupational Health - Occupational Stress Questionnaire    Feeling of Stress : Not at all  Social Connections: Socially Isolated (01/31/2023)   Social Connection and Isolation Panel [NHANES]    Frequency of Communication with Friends and Family: More than three times a week    Frequency of Social Gatherings with Friends and Family: More than three times a week    Attends Religious Services: Never    Database administrator or Organizations: No    Attends Engineer, structural: Not on file    Marital Status: Never married   Catering manager Violence: Not on file    Family History  Problem Relation Age of Onset   Diabetes Father    Diabetes Maternal Grandmother    Diabetes Paternal Grandmother      Review of Systems  Constitutional: Negative.  Negative for chills and fever.  HENT: Negative.  Negative for congestion and sore throat.   Respiratory: Negative.  Negative for cough and shortness of breath.   Cardiovascular:  Negative for chest pain and palpitations.  Gastrointestinal:  Negative for abdominal pain, nausea and vomiting.  Genitourinary: Negative.   Skin: Negative.  Negative for rash.  Neurological: Negative.  Negative for dizziness and headaches.  All other systems reviewed and are negative.   Vitals:   03/26/23 1429  BP: (!) 138/92  Pulse: 92  Temp: 98.3 F (36.8 C)  SpO2: 97%    Physical Exam Vitals reviewed.  Constitutional:      Appearance: Normal appearance. He is obese.  HENT:     Head: Normocephalic.     Right Ear: Tympanic membrane, ear canal and external ear normal.     Left Ear: Tympanic membrane, ear canal and external ear normal.     Mouth/Throat:     Mouth: Mucous membranes are moist.     Pharynx: Oropharynx is clear. No oropharyngeal exudate.  Eyes:     Extraocular Movements: Extraocular movements intact.     Conjunctiva/sclera: Conjunctivae normal.     Pupils: Pupils are equal, round, and reactive to light.  Cardiovascular:     Rate and Rhythm: Normal rate and regular rhythm.     Pulses: Normal pulses.     Heart sounds: Normal heart sounds.  Pulmonary:     Effort: Pulmonary effort is normal.     Breath sounds: Normal breath sounds.  Abdominal:     Palpations: Abdomen is soft.     Tenderness: There is no abdominal tenderness.  Musculoskeletal:     Cervical back: No tenderness.  Lymphadenopathy:     Cervical: No cervical adenopathy.  Skin:    General: Skin is warm and dry.     Capillary Refill: Capillary refill takes less than 2 seconds.   Neurological:     General: No focal deficit present.     Mental Status: He is alert and oriented to person, place, and time.  Psychiatric:        Mood and Affect: Mood normal.        Behavior: Behavior normal.      ASSESSMENT & PLAN: Problem List Items Addressed This Visit       Other   Borderline diabetes    Stop metformin Will start Mounjaro 5 mg weekly Will also help with weight loss      Relevant Medications   tirzepatide (MOUNJARO) 5  MG/0.5ML Pen   Other Relevant Orders   CBC with Differential   Comprehensive metabolic panel   Hemoglobin A1c   Lipid panel   Morbid obesity (HCC)    Eating better and losing weight. Diet and nutrition discussed. Benefits of exercise discussed Advised to decrease amount of daily carbohydrate intake and daily calories and increase amount of plant-based protein in his diet      Relevant Medications   tirzepatide (MOUNJARO) 5 MG/0.5ML Pen   Other Relevant Orders   CBC with Differential   Comprehensive metabolic panel   Hemoglobin A1c   Lipid panel   Suspected sleep apnea    Recent sleep study report not available yet.      Other Visit Diagnoses     Encounter for general adult medical examination with abnormal findings    -  Primary   Relevant Orders   CBC with Differential   Comprehensive metabolic panel   Hemoglobin A1c   Lipid panel   Screening for deficiency anemia       Relevant Orders   CBC with Differential   Screening for lipoid disorders       Relevant Orders   Lipid panel   Screening for endocrine, metabolic and immunity disorder       Relevant Orders   Comprehensive metabolic panel   Hemoglobin A1c      Modifiable risk factors discussed with patient. Anticipatory guidance according to age provided. The following topics were also discussed: Social Determinants of Health Smoking.  Non-smoker Diet and nutrition and need to decrease amount of daily carbohydrate intake and daily calories and increase  amount of plant-based protein in his diet Benefits of exercise Cancer family history review Vaccinations review and recommendations Cardiovascular risk assessment Mental health including depression and anxiety Fall and accident prevention  Patient Instructions  Health Maintenance, Male Adopting a healthy lifestyle and getting preventive care are important in promoting health and wellness. Ask your health care provider about: The right schedule for you to have regular tests and exams. Things you can do on your own to prevent diseases and keep yourself healthy. What should I know about diet, weight, and exercise? Eat a healthy diet  Eat a diet that includes plenty of vegetables, fruits, low-fat dairy products, and lean protein. Do not eat a lot of foods that are high in solid fats, added sugars, or sodium. Maintain a healthy weight Body mass index (BMI) is a measurement that can be used to identify possible weight problems. It estimates body fat based on height and weight. Your health care provider can help determine your BMI and help you achieve or maintain a healthy weight. Get regular exercise Get regular exercise. This is one of the most important things you can do for your health. Most adults should: Exercise for at least 150 minutes each week. The exercise should increase your heart rate and make you sweat (moderate-intensity exercise). Do strengthening exercises at least twice a week. This is in addition to the moderate-intensity exercise. Spend less time sitting. Even light physical activity can be beneficial. Watch cholesterol and blood lipids Have your blood tested for lipids and cholesterol at 31 years of age, then have this test every 5 years. You may need to have your cholesterol levels checked more often if: Your lipid or cholesterol levels are high. You are older than 31 years of age. You are at high risk for heart disease. What should I know about cancer screening? Many  types of cancers can be  detected early and may often be prevented. Depending on your health history and family history, you may need to have cancer screening at various ages. This may include screening for: Colorectal cancer. Prostate cancer. Skin cancer. Lung cancer. What should I know about heart disease, diabetes, and high blood pressure? Blood pressure and heart disease High blood pressure causes heart disease and increases the risk of stroke. This is more likely to develop in people who have high blood pressure readings or are overweight. Talk with your health care provider about your target blood pressure readings. Have your blood pressure checked: Every 3-5 years if you are 21-23 years of age. Every year if you are 16 years old or older. If you are between the ages of 89 and 59 and are a current or former smoker, ask your health care provider if you should have a one-time screening for abdominal aortic aneurysm (AAA). Diabetes Have regular diabetes screenings. This checks your fasting blood sugar level. Have the screening done: Once every three years after age 45 if you are at a normal weight and have a low risk for diabetes. More often and at a younger age if you are overweight or have a high risk for diabetes. What should I know about preventing infection? Hepatitis B If you have a higher risk for hepatitis B, you should be screened for this virus. Talk with your health care provider to find out if you are at risk for hepatitis B infection. Hepatitis C Blood testing is recommended for: Everyone born from [birth year] through 1964. Anyone with known risk factors for hepatitis C. Sexually transmitted infections (STIs) You should be screened each year for STIs, including gonorrhea and chlamydia, if: You are sexually active and are younger than 31 years of age. You are older than 31 years of age and your health care provider tells you that you are at risk for this type of infection. Your  sexual activity has changed since you were last screened, and you are at increased risk for chlamydia or gonorrhea. Ask your health care provider if you are at risk. Ask your health care provider about whether you are at high risk for HIV. Your health care provider may recommend a prescription medicine to help prevent HIV infection. If you choose to take medicine to prevent HIV, you should first get tested for HIV. You should then be tested every 3 months for as long as you are taking the medicine. Follow these instructions at home: Alcohol use Do not drink alcohol if your health care provider tells you not to drink. If you drink alcohol: Limit how much you have to 0-2 drinks a day. Know how much alcohol is in your drink. In the [place] one drink equals one 12 oz bottle of beer (355 mL), one 5 oz glass of wine (148 mL), or one 1 oz glass of hard liquor (44 mL). Lifestyle Do not use any products that contain nicotine or tobacco. These products include cigarettes, chewing tobacco, and vaping devices, such as e-cigarettes. If you need help quitting, ask your health care provider. Do not use street drugs. Do not share needles. Ask your health care provider for help if you need support or information about quitting drugs. General instructions Schedule regular health, dental, and eye exams. Stay current with your vaccines. Tell your health care provider if: You often feel depressed. You have ever been abused or do not feel safe at home. Summary Adopting a healthy lifestyle and getting preventive care are important  in promoting health and wellness. Follow your health care provider's instructions about healthy diet, exercising, and getting tested or screened for diseases. Follow your health care provider's instructions on monitoring your cholesterol and blood pressure. This information is not intended to replace advice given to you by your health care provider. Make sure you discuss any questions you  have with your health care provider. Document Revised: 01/29/2021 Document Reviewed: 01/29/2021 Elsevier Patient Education  2024 Elsevier Inc.      Edwina Barth, MD [facility]

## 2023-03-26 NOTE — Patient Instructions (Signed)
Health Maintenance, Male Adopting a healthy lifestyle and getting preventive care are important in promoting health and wellness. Ask your health care provider about: The right schedule for you to have regular tests and exams. Things you can do on your own to prevent diseases and keep yourself healthy. What should I know about diet, weight, and exercise? Eat a healthy diet  Eat a diet that includes plenty of vegetables, fruits, low-fat dairy products, and lean protein. Do not eat a lot of foods that are high in solid fats, added sugars, or sodium. Maintain a healthy weight Body mass index (BMI) is a measurement that can be used to identify possible weight problems. It estimates body fat based on height and weight. Your health care provider can help determine your BMI and help you achieve or maintain a healthy weight. Get regular exercise Get regular exercise. This is one of the most important things you can do for your health. Most adults should: Exercise for at least 150 minutes each week. The exercise should increase your heart rate and make you sweat (moderate-intensity exercise). Do strengthening exercises at least twice a week. This is in addition to the moderate-intensity exercise. Spend less time sitting. Even light physical activity can be beneficial. Watch cholesterol and blood lipids Have your blood tested for lipids and cholesterol at 31 years of age, then have this test every 5 years. You may need to have your cholesterol levels checked more often if: Your lipid or cholesterol levels are high. You are older than 31 years of age. You are at high risk for heart disease. What should I know about cancer screening? Many types of cancers can be detected early and may often be prevented. Depending on your health history and family history, you may need to have cancer screening at various ages. This may include screening for: Colorectal cancer. Prostate cancer. Skin cancer. Lung  cancer. What should I know about heart disease, diabetes, and high blood pressure? Blood pressure and heart disease High blood pressure causes heart disease and increases the risk of stroke. This is more likely to develop in people who have high blood pressure readings or are overweight. Talk with your health care provider about your target blood pressure readings. Have your blood pressure checked: Every 3-5 years if you are 18-39 years of age. Every year if you are 40 years old or older. If you are between the ages of 65 and 75 and are a current or former smoker, ask your health care provider if you should have a one-time screening for abdominal aortic aneurysm (AAA). Diabetes Have regular diabetes screenings. This checks your fasting blood sugar level. Have the screening done: Once every three years after age 45 if you are at a normal weight and have a low risk for diabetes. More often and at a younger age if you are overweight or have a high risk for diabetes. What should I know about preventing infection? Hepatitis B If you have a higher risk for hepatitis B, you should be screened for this virus. Talk with your health care provider to find out if you are at risk for hepatitis B infection. Hepatitis C Blood testing is recommended for: Everyone born from 1945 through 1965. Anyone with known risk factors for hepatitis C. Sexually transmitted infections (STIs) You should be screened each year for STIs, including gonorrhea and chlamydia, if: You are sexually active and are younger than 31 years of age. You are older than 31 years of age and your   health care provider tells you that you are at risk for this type of infection. Your sexual activity has changed since you were last screened, and you are at increased risk for chlamydia or gonorrhea. Ask your health care provider if you are at risk. Ask your health care provider about whether you are at high risk for HIV. Your health care provider  may recommend a prescription medicine to help prevent HIV infection. If you choose to take medicine to prevent HIV, you should first get tested for HIV. You should then be tested every 3 months for as long as you are taking the medicine. Follow these instructions at home: Alcohol use Do not drink alcohol if your health care provider tells you not to drink. If you drink alcohol: Limit how much you have to 0-2 drinks a day. Know how much alcohol is in your drink. In the [place] one drink equals one 12 oz bottle of beer (355 mL), one 5 oz glass of wine (148 mL), or one 1 oz glass of hard liquor (44 mL). Lifestyle Do not use any products that contain nicotine or tobacco. These products include cigarettes, chewing tobacco, and vaping devices, such as e-cigarettes. If you need help quitting, ask your health care provider. Do not use street drugs. Do not share needles. Ask your health care provider for help if you need support or information about quitting drugs. General instructions Schedule regular health, dental, and eye exams. Stay current with your vaccines. Tell your health care provider if: You often feel depressed. You have ever been abused or do not feel safe at home. Summary Adopting a healthy lifestyle and getting preventive care are important in promoting health and wellness. Follow your health care provider's instructions about healthy diet, exercising, and getting tested or screened for diseases. Follow your health care provider's instructions on monitoring your cholesterol and blood pressure. This information is not intended to replace advice given to you by your health care provider. Make sure you discuss any questions you have with your health care provider. Document Revised: 01/29/2021 Document Reviewed: 01/29/2021 Elsevier Patient Education  2024 Elsevier Inc.  

## 2023-03-26 NOTE — Assessment & Plan Note (Signed)
Recent sleep study report not available yet.

## 2023-03-26 NOTE — Assessment & Plan Note (Signed)
Eating better and losing weight. Diet and nutrition discussed. Benefits of exercise discussed Advised to decrease amount of daily carbohydrate intake and daily calories and increase amount of plant-based protein in his diet

## 2023-03-26 NOTE — Assessment & Plan Note (Addendum)
Stop metformin Will start Mounjaro 5 mg weekly Will also help with weight loss

## 2023-03-26 NOTE — Progress Notes (Signed)
[facility]   HOME SLEEP TEST REPORT ( Mail out - by Watch PAT)   STUDY DATA:  03-26-2023   Nathan Wilkerson 32 year old male 03/13/1991 ORDERING CLINICIAN:  Melvyn Novas, MD  REFERRING CLINICIAN: PCP Alvy Bimler, MD   CLINICAL INFORMATION/HISTORY: [name] is a 32 y.o. male patient who is seen upon referral on 03/17/2023 from Dr Alvy Bimler  for a Sleep study.  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  Chief concern according to patient :  " non restorative sleep ".     Epworth sleepiness score: 7/24.  FSS at  37/63 points.    BMI: 43.7 kg/m   Neck Circumference: 18"   FINDINGS:   Sleep Summary:   Total Recording Time (hours, min):     7 hours 47 minutes   Total Sleep Time (hours, min): 6 hours 20 minutes               Percent REM (%): 17.5%                                       Respiratory Indices:   Calculated pAHI (per hour):        48.1/h                     REM pAHI:     71.1/h                                            NREM pAHI:    45.4/h                          Supine AHI:   50.5/h, versus unknown supine AHI of 37/h.  The patient only slept on the left side and supine.   Snoring reached a mean volume of 43 dB and was present for 50% of total sleep time.   Oxygen Saturation Statistics:   O2 Saturation Range (%):    Oxygen range between a nadir of 74% of the maximum of 100% with a mean saturation at 93%.                                   O2 Saturation (minutes) <89%:    8.5 minutes       Pulse Rate Statistics:   Pulse Mean (bpm):     88 bpm            Pulse Range:        Between 62 and 112 bpm.         IMPRESSION:  This HST confirms the presence of severe obstructive sleep apnea.  This home sleep test device did not detect any central apneas this apnea is also strongly REM sleep accentuated and not positional dependent.  I recommend a positive airway pressure therapy for REM sleep  dependent sleep apnea   RECOMMENDATION: Aside from weight loss there is not that much to be gained from a dental device or an inspire device use the most effective treatment will be positive airway pressure such as an CPAP or BiPAP.  DME :  I will order an auto titration device between 5 and 20 cm water pressure with 3 cm EPR, heated humidification and mask to be fitted for this patient.  Please fit the mask  when the patient is reclined.    INTERPRETING PHYSICIAN:   Melvyn Novas, MD

## 2023-03-28 ENCOUNTER — Telehealth: Payer: Self-pay | Admitting: Emergency Medicine

## 2023-03-28 ENCOUNTER — Other Ambulatory Visit: Payer: Self-pay | Admitting: Emergency Medicine

## 2023-03-28 DIAGNOSIS — E1169 Type 2 diabetes mellitus with other specified complication: Secondary | ICD-10-CM

## 2023-03-28 MED ORDER — ROSUVASTATIN CALCIUM 20 MG PO TABS: 20.0000 mg | ORAL_TABLET | Freq: Every day | ORAL | 3 refills | Status: DC

## 2023-03-28 MED ORDER — EMPAGLIFLOZIN 10 MG PO TABS: 10.0000 mg | ORAL_TABLET | Freq: Every day | ORAL | 5 refills | Status: DC

## 2023-03-28 MED ORDER — GLIPIZIDE 5 MG PO TABS: 5.0000 mg | ORAL_TABLET | Freq: Every day | ORAL | 3 refills | Status: DC

## 2023-03-28 NOTE — Telephone Encounter (Signed)
Patient's insurance company called to get a prior authorization started for tirzepatide [facility]) 5 MG/0.5ML Pen . She said it could be completed either by using Covermymeds or by faxing to [phone]. She also said for a verbal PA to call [phone], option 2.

## 2023-03-30 NOTE — Addendum Note (Signed)
Addended by: Melvyn Novas on: 03/30/2023 05:21 PM   Modules accepted: Orders

## 2023-03-30 NOTE — Procedures (Signed)
[facility]   HOME SLEEP TEST REPORT ( Mail out - by Watch PAT)   STUDY DATA:  03-26-2023   Nathan Wilkerson 32 year old male [birth year] ORDERING CLINICIAN:  Melvyn Novas, MD  REFERRING CLINICIAN: PCP Alvy Bimler, MD   CLINICAL INFORMATION/HISTORY: Nathan Wilkerson is a 32 y.o. male patient who is seen upon referral on 03/17/2023 from Dr Alvy Bimler  for a Sleep study.  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  Chief concern according to patient :  " non restorative sleep ".     Epworth sleepiness score: 7/24.  FSS at  37/63 points.    BMI: 43.7 kg/m   Neck Circumference: 18"   FINDINGS:   Sleep Summary:   Total Recording Time (hours, min):     7 hours 47 minutes   Total Sleep Time (hours, min): 6 hours 20 minutes               Percent REM (%): 17.5%                                       Respiratory Indices:   Calculated pAHI (per hour):        48.1/h                     REM pAHI:     71.1/h                                            NREM pAHI:    45.4/h                          Supine AHI:   50.5/h, versus unknown supine AHI of 37/h.  The patient only slept on the left side and supine.   Snoring reached a mean volume of 43 dB and was present for 50% of total sleep time.   Oxygen Saturation Statistics:   O2 Saturation Range (%):    Oxygen range between a nadir of 74% of the maximum of 100% with a mean saturation at 93%.                                   O2 Saturation (minutes) <89%:    8.5 minutes       Pulse Rate Statistics:   Pulse Mean (bpm):     88 bpm            Pulse Range:        Between 62 and 112 bpm.         IMPRESSION:  This HST confirms the presence of severe obstructive sleep apnea.  This home sleep test device did not detect any central apneas this apnea is also strongly REM sleep accentuated and not positional dependent.  I recommend a positive airway pressure therapy for REM sleep dependent  sleep apnea   RECOMMENDATION: Aside from weight loss there is not that much to be gained from a dental device or an inspire device use the most effective treatment will be positive airway pressure such as an CPAP or BiPAP.   DME :  I  will order an auto titration device between 5 and 20 cm water pressure with 3 cm EPR, heated humidification and mask to be fitted for this patient.  Please fit the mask  when the patient is reclined.    INTERPRETING PHYSICIAN:   Melvyn Novas, MD

## 2023-03-31 ENCOUNTER — Other Ambulatory Visit (HOSPITAL_COMMUNITY): Payer: Self-pay

## 2023-03-31 ENCOUNTER — Telehealth: Payer: Self-pay

## 2023-03-31 NOTE — Telephone Encounter (Signed)
Patient Advocate Encounter  Prior Authorization for [employer] 5mg /0.17ml has been approved with [employer].    PA# [id number] Effective dates: 03/31/23 through 03/30/24  Per [facility] test claim, copay for 28 days supply is $25  Left a voice mail with the pharmacy to notify of the approval.

## 2023-04-01 ENCOUNTER — Telehealth: Payer: Self-pay

## 2023-04-01 NOTE — Telephone Encounter (Signed)
-----   Message from Melvyn Novas, MD sent at 03/30/2023  5:21 PM EDT ----- Aside from weight loss there is not that much to be gained from a dental device or an inspire device use the most effective treatment will be positive airway pressure such as an CPAP or BiPAP.    DME :  I will order an auto titration device between 5 and 20 cm water pressure with 3 cm EPR, heated humidification and mask to be fitted for this patient.  Please fit the mask  when the patient is reclined.

## 2023-04-01 NOTE — Telephone Encounter (Signed)
I called pt. I advised pt that Dr. Vickey Huger reviewed their sleep study results and found that pt has severe osa. Dr. Vickey Huger recommends that pt start an auto PAP at home. I reviewed PAP compliance expectations with the pt. Pt is agreeable to starting an auto-PAP. I advised pt that an order will be sent to a DME, [facility], and [facility] will call the pt within about one week after they file with the pt's insurance. [facility] will show the pt how to use the machine, fit for masks, and troubleshoot the auto-PAP if needed. A follow up appt was made for insurance purposes with Maralyn Sago, NP on 07/16/2023 at 12:45pm. Pt verbalized understanding to arrive 15 minutes early and bring their auto-PAP. Pt verbalized understanding of results. Pt had no questions at this time but was encouraged to call back if questions arise. I have sent the order to [facility] and have received confirmation that they have received the order.

## 2023-04-07 ENCOUNTER — Telehealth: Payer: Self-pay | Admitting: Emergency Medicine

## 2023-04-07 ENCOUNTER — Emergency Department (HOSPITAL_COMMUNITY)
Admission: EM | Admit: 2023-04-07 | Discharge: 2023-04-07 | Disposition: A | Discharge status: Home / Self Care | Payer: Managed Care, Other (non HMO) | Attending: Emergency Medicine | Admitting: Emergency Medicine

## 2023-04-07 ENCOUNTER — Other Ambulatory Visit: Payer: Self-pay

## 2023-04-07 ENCOUNTER — Encounter (HOSPITAL_COMMUNITY): Payer: Self-pay

## 2023-04-07 DIAGNOSIS — E1165 Type 2 diabetes mellitus with hyperglycemia: Secondary | ICD-10-CM | POA: Diagnosis not present

## 2023-04-07 DIAGNOSIS — R739 Hyperglycemia, unspecified: Secondary | ICD-10-CM | POA: Diagnosis present

## 2023-04-07 DIAGNOSIS — R110 Nausea: Secondary | ICD-10-CM

## 2023-04-07 DIAGNOSIS — Z7984 Long term (current) use of oral hypoglycemic drugs: Secondary | ICD-10-CM | POA: Insufficient documentation

## 2023-04-07 DIAGNOSIS — R112 Nausea with vomiting, unspecified: Secondary | ICD-10-CM | POA: Insufficient documentation

## 2023-04-07 HISTORY — DX: Sleep apnea, unspecified: G47.30

## 2023-04-07 HISTORY — DX: Schizophrenia, unspecified: F20.9

## 2023-04-07 HISTORY — DX: Type 2 diabetes mellitus without complications: E11.9

## 2023-04-07 LAB — HEPATIC FUNCTION PANEL
ALT: 61 U/L — ABNORMAL HIGH (ref 0–44)
AST: 42 U/L — ABNORMAL HIGH (ref 15–41)
Albumin: 4 g/dL (ref 3.5–5.0)
Alkaline Phosphatase: 49 U/L (ref 38–126)
Bilirubin, Direct: 0.1 mg/dL (ref 0.0–0.2)
Indirect Bilirubin: 0.6 mg/dL (ref 0.3–0.9)
Total Bilirubin: 0.7 mg/dL (ref 0.3–1.2)
Total Protein: 7.2 g/dL (ref 6.5–8.1)

## 2023-04-07 LAB — CBC
HCT: 43.3 % (ref 39.0–52.0)
Hemoglobin: 14 g/dL (ref 13.0–17.0)
MCH: 27.1 pg (ref 26.0–34.0)
MCHC: 32.3 g/dL (ref 30.0–36.0)
MCV: 83.8 fL (ref 80.0–100.0)
Platelets: 178 10*3/uL (ref 150–400)
RBC: 5.17 MIL/uL (ref 4.22–5.81)
RDW: 14.3 % (ref 11.5–15.5)
WBC: 3.8 10*3/uL — ABNORMAL LOW (ref 4.0–10.5)
nRBC: 0 % (ref 0.0–0.2)

## 2023-04-07 LAB — CBG MONITORING, ED
Glucose-Capillary: 314 mg/dL — ABNORMAL HIGH (ref 70–99)
Glucose-Capillary: 254 mg/dL — ABNORMAL HIGH (ref 70–99)

## 2023-04-07 LAB — LIPASE, BLOOD: Lipase: 53 U/L — ABNORMAL HIGH (ref 11–51)

## 2023-04-07 LAB — BASIC METABOLIC PANEL
Anion gap: 8 (ref 5–15)
BUN: 7 mg/dL (ref 6–20)
CO2: 24 mmol/L (ref 22–32)
Calcium: 9.1 mg/dL (ref 8.9–10.3)
Chloride: 99 mmol/L (ref 98–111)
Creatinine, Ser: 0.92 mg/dL (ref 0.61–1.24)
GFR, Estimated: >60 mL/min (ref >60)
Glucose, Bld: 325 mg/dL — ABNORMAL HIGH (ref 70–99)
Potassium: 4.3 mmol/L (ref 3.5–5.1)
Sodium: 131 mmol/L — ABNORMAL LOW (ref 135–145)

## 2023-04-07 MED ORDER — BLOOD GLUCOSE TEST VI STRP: 1.0000 | ORAL_STRIP | Freq: Three times a day (TID) | 0 refills | Status: AC

## 2023-04-07 MED ORDER — LACTATED RINGERS IV BOLUS
1000.0000 mL | Freq: Once | INTRAVENOUS | Status: AC
  Administered 2023-04-07: 1000 mL via INTRAVENOUS

## 2023-04-07 MED ORDER — LANCETS MISC. MISC: 1.0000 | Freq: Three times a day (TID) | 0 refills | Status: DC

## 2023-04-07 MED ORDER — BLOOD GLUCOSE MONITORING SUPPL DEVI: 1.0000 | Freq: Three times a day (TID) | 0 refills | Status: AC

## 2023-04-07 MED ORDER — LANCET DEVICE MISC: 1.0000 | Freq: Three times a day (TID) | 0 refills | Status: AC

## 2023-04-07 MED ORDER — ONDANSETRON HCL 4 MG PO TABS: 4.0000 mg | ORAL_TABLET | Freq: Four times a day (QID) | ORAL | 0 refills | Status: DC

## 2023-04-07 MED ORDER — ONDANSETRON HCL 4 MG/2ML IJ SOLN
4.0000 mg | Freq: Once | INTRAMUSCULAR | Status: AC
  Administered 2023-04-07: 4 mg via INTRAVENOUS
  Filled 2023-04-07: qty 2

## 2023-04-07 NOTE — Discharge Instructions (Addendum)
You were seen in the emergency department for your nausea and vomiting.  Your blood sugar was high here.  No signs of complication from your blood sugar being high.  I have given you a prescription for a nausea medicine that you can continue to take as needed as well as a glucose meter so that you can check your sugars at home.  You should follow-up with your primary doctor in the next few days to have your symptoms and blood sugar rechecked and to determine if you can continue on the [facility] as this might be a side effect from that medication.  You should return to the emergency department if you are having repetitive vomiting despite the nausea medicine, you have severe abdominal pain, you are glucose is too high to read on your meter or if you have any other new or concerning symptoms.

## 2023-04-07 NOTE — ED Triage Notes (Signed)
Patient stated was diagnosed with type 2 diabetes 2 weeks ago. Just began Monjaro injections last week. Stated today he woke up and vomited x1, has a headache and is woozy.

## 2023-04-07 NOTE — Telephone Encounter (Signed)
Patient wants someone to call him to discuss him medications - he has questions about him medications.,

## 2023-04-07 NOTE — Telephone Encounter (Signed)
/  Called patient he is scheduled to come into the office to see provider on 7/18

## 2023-04-07 NOTE — ED Provider Notes (Signed)
[place] EMERGENCY DEPARTMENT AT [facility] Provider Note   CSN: [id number] Arrival date & time: 04/07/23  0756     History  Chief Complaint  Patient presents with   Hyperglycemia    Nathan Wilkerson is a 32 y.o. male.  Patient is a 32 year old male with a past medical history of diabetes, sleep apnea and schizophrenia presenting to the emergency department with nausea and vomiting.  The patient states that he was transition to [facility] last week for his diabetes and woke up this morning with nausea and vomiting.  He denies any associated abdominal pain, fevers or chills, diarrhea or constipation, dysuria or hematuria.  He states that he received the first dose of the [facility] last Thursday.  The history is provided by the patient.  Hyperglycemia      Home Medications Prior to Admission medications   Medication Sig Start Date End Date Taking? Authorizing Provider  Blood Glucose Monitoring Suppl DEVI 1 each by Does not apply route in the morning, at noon, and at bedtime. May substitute to any manufacturer covered by patient's insurance. 04/07/23  Yes Theresia Lo, [place] K, DO  Glucose Blood (BLOOD GLUCOSE TEST STRIPS) STRP 1 each by In Vitro route in the morning, at noon, and at bedtime. May substitute to any manufacturer covered by patient's insurance. 04/07/23 05/10/23 Yes Rexford Maus, DO  Lancet Device MISC 1 each by Does not apply route in the morning, at noon, and at bedtime. May substitute to any manufacturer covered by patient's insurance. 04/07/23 05/07/23 Yes Elayne Snare K, DO  Lancets Misc. MISC 1 each by Does not apply route in the morning, at noon, and at bedtime. May substitute to any manufacturer covered by patient's insurance. 04/07/23 05/07/23 Yes Kingsley, Benetta Spar K, DO  ondansetron (ZOFRAN) 4 MG tablet Take 1 tablet (4 mg total) by mouth every 6 (six) hours. 04/07/23  Yes Theresia Lo, Turkey K, DO  amitriptyline (ELAVIL) 25 MG tablet Take 25 mg by  mouth at bedtime.    [provider]  ARIPiprazole (ABILIFY) 20 MG tablet Take 20 mg by mouth daily.    [provider]  empagliflozin (JARDIANCE) 10 MG TABS tablet Take 1 tablet (10 mg total) by mouth daily before breakfast. 03/28/23   Georgina Quint, MD  glipiZIDE (GLUCOTROL) 5 MG tablet Take 1 tablet (5 mg total) by mouth daily before breakfast. 03/28/23   Georgina Quint, MD  levocetirizine (XYZAL) 5 MG tablet Take 1 tablet (5 mg total) by mouth every evening. Patient not taking: Reported on 03/17/2023 01/10/23   Elenore Paddy, NP  metFORMIN (GLUCOPHAGE) 500 MG tablet Take 1 tablet (500 mg total) by mouth 2 (two) times daily with a meal. 07/15/22   Sagardia, Eilleen Kempf, MD  OVER THE COUNTER MEDICATION     [provider]  rosuvastatin (CRESTOR) 20 MG tablet Take 1 tablet (20 mg total) by mouth daily. 03/28/23   Georgina Quint, MD  tirzepatide [facility]) 5 MG/0.5ML Pen Inject 5 mg into the skin once a week. 03/26/23   Georgina Quint, MD      Allergies    Amoxicillin    Review of Systems   Review of Systems  Physical Exam Updated Vital Signs BP (!) 154/94   Pulse 93   Temp 98 F (36.7 C) (Oral)   Resp 18   Ht 5\' 8"  (1.727 m)   Wt 127.5 kg   SpO2 97%   BMI 42.73 kg/m  Physical Exam Vitals and  nursing note reviewed.  Constitutional:      General: He is not in acute distress.    Appearance: Normal appearance.  HENT:     Head: Normocephalic and atraumatic.     Nose: Nose normal.     Mouth/Throat:     Mouth: Mucous membranes are moist.     Pharynx: Oropharynx is clear.  Eyes:     Extraocular Movements: Extraocular movements intact.     Conjunctiva/sclera: Conjunctivae normal.  Cardiovascular:     Rate and Rhythm: Normal rate and regular rhythm.     Heart sounds: Normal heart sounds.  Pulmonary:     Effort: Pulmonary effort is normal.     Breath sounds: Normal breath sounds.  Abdominal:     General: Abdomen is flat.      Palpations: Abdomen is soft.     Tenderness: There is no abdominal tenderness.  Musculoskeletal:        General: Normal range of motion.     Cervical back: Normal range of motion.  Skin:    General: Skin is warm and dry.  Neurological:     General: No focal deficit present.     Mental Status: He is alert and oriented to person, place, and time.  Psychiatric:        Mood and Affect: Mood normal.        Behavior: Behavior normal.     ED Results / Procedures / Treatments   Labs (all labs ordered are listed, but only abnormal results are displayed) Labs Reviewed  BASIC METABOLIC PANEL - Abnormal; Notable for the following components:      Result Value   Sodium 131 (*)    Glucose, Bld 325 (*)    All other components within normal limits  CBC - Abnormal; Notable for the following components:   WBC 3.8 (*)    All other components within normal limits  HEPATIC FUNCTION PANEL - Abnormal; Notable for the following components:   AST 42 (*)    ALT 61 (*)    All other components within normal limits  LIPASE, BLOOD - Abnormal; Notable for the following components:   Lipase 53 (*)    All other components within normal limits  CBG MONITORING, ED - Abnormal; Notable for the following components:   Glucose-Capillary 314 (*)    All other components within normal limits  CBG MONITORING, ED - Abnormal; Notable for the following components:   Glucose-Capillary 254 (*)    All other components within normal limits  URINALYSIS, ROUTINE W REFLEX MICROSCOPIC  CBG MONITORING, ED    EKG None  Radiology No results found.  Procedures Procedures    Medications Ordered in ED Medications  lactated ringers bolus 1,000 mL (0 mLs Intravenous Stopped 04/07/23 1038)  ondansetron (ZOFRAN) injection 4 mg (4 mg Intravenous Given 04/07/23 0844)    ED Course/ Medical Decision Making/ A&P Clinical Course as of 04/07/23 1049  Mon Apr 07, 2023  0916 Labs with hyperglycemia, no evidence of DKA. [VK]   0935 LFTs at baseline, no significant elevation of lipase. [VK]  [id number] Patient reports symptoms improved. Will repeat accucheck after IVF prior to discharge. [VK]  1047 Rpt accucheck downtrending. Patient is stable for discharge with outpatient follow up. [VK]    Clinical Course User Index [VK] Rexford Maus, DO                             Medical Decision  Making This patient presents to the ED with chief complaint(s) of N/V with pertinent past medical history of DM on Manjaro which further complicates the presenting complaint. The complaint involves an extensive differential diagnosis and also carries with it a high risk of complications and morbidity.    The differential diagnosis includes hyperglycemic crisis, DKA, electrolyte abnormality, pancreatitis, hepatitis, gastritis, GERD, no abdominal tenderness making cholelithiasis, cholecystitis or other intra-abdominal infection less likely  Additional history obtained: Additional history obtained from N/A Records reviewed Primary Care Documents  ED Course and Reassessment: On patient's arrival to the emergency department he is hemodynamically stable in no acute distress.  Accu-Chek on arrival did show hyperglycemia with a glucose of 314.  He will be started on Zofran and fluids and will have labs performed to evaluate for possible DKA or other hyperglycemic crisis and he will be closely reassessed.  Independent labs interpretation:  The following labs were independently interpreted: hyperglycemia without DKA, transaminitis at baseline  Independent visualization of imaging: - N/A  Consultation: - Consulted or discussed management/test interpretation w/ external professional: N/A  Consideration for admission or further workup: Patient has no emergent conditions requiring admission or further work-up at this time and is stable for discharge home with primary care follow-up  Social Determinants of health: N/A    Amount and/or  Complexity of Data Reviewed Labs: ordered.  Risk OTC drugs. Prescription drug management.          Final Clinical Impression(s) / ED Diagnoses Final diagnoses:  Hyperglycemia  Nausea    Rx / DC Orders ED Discharge Orders          Ordered    Blood Glucose Monitoring Suppl DEVI  3 times daily        04/07/23 0941    Glucose Blood (BLOOD GLUCOSE TEST STRIPS) STRP  3 times daily        04/07/23 0941    Lancet Device MISC  3 times daily        04/07/23 0941    Lancets Misc. MISC  3 times daily        04/07/23 0941    ondansetron (ZOFRAN) 4 MG tablet  Every 6 hours        04/07/23 1610              Rexford Maus, DO 04/07/23 1049

## 2023-04-10 ENCOUNTER — Encounter: Payer: Self-pay | Admitting: Emergency Medicine

## 2023-04-10 ENCOUNTER — Ambulatory Visit (INDEPENDENT_AMBULATORY_CARE_PROVIDER_SITE_OTHER): Payer: Managed Care, Other (non HMO) | Admitting: Emergency Medicine

## 2023-04-10 VITALS — BP 140/86 | HR 86 | Temp 98.4°F | Ht 68.0 in | Wt 270.0 lb

## 2023-04-10 DIAGNOSIS — E785 Hyperlipidemia, unspecified: Secondary | ICD-10-CM

## 2023-04-10 DIAGNOSIS — E1165 Type 2 diabetes mellitus with hyperglycemia: Secondary | ICD-10-CM | POA: Diagnosis not present

## 2023-04-10 DIAGNOSIS — Z7985 Long-term (current) use of injectable non-insulin antidiabetic drugs: Secondary | ICD-10-CM

## 2023-04-10 DIAGNOSIS — E1169 Type 2 diabetes mellitus with other specified complication: Secondary | ICD-10-CM | POA: Diagnosis not present

## 2023-04-10 LAB — GLUCOSE, POCT (MANUAL RESULT ENTRY): POC Glucose: 282 mg/dl — AB (ref 70–99)

## 2023-04-10 MED ORDER — METFORMIN HCL 500 MG PO TABS
500.0000 mg | ORAL_TABLET | Freq: Two times a day (BID) | ORAL | 3 refills | Status: DC
Start: 2023-04-10 — End: 2023-11-14

## 2023-04-10 MED ORDER — ROSUVASTATIN CALCIUM 20 MG PO TABS
20.0000 mg | ORAL_TABLET | Freq: Every day | ORAL | 3 refills | Status: AC
Start: 2023-04-10 — End: ?

## 2023-04-10 MED ORDER — EMPAGLIFLOZIN 10 MG PO TABS
10.0000 mg | ORAL_TABLET | Freq: Every day | ORAL | 5 refills | Status: DC
Start: 2023-04-10 — End: 2023-09-01

## 2023-04-10 MED ORDER — GLIPIZIDE 5 MG PO TABS
5.0000 mg | ORAL_TABLET | Freq: Every day | ORAL | 3 refills | Status: DC
Start: 2023-04-10 — End: 2023-04-28

## 2023-04-10 MED ORDER — FREESTYLE LIBRE 3 SENSOR MISC
3 refills | Status: AC
Start: 2023-04-10 — End: ?

## 2023-04-10 NOTE — Assessment & Plan Note (Signed)
Cardiovascular risks associated with uncontrolled diabetes discussed Diet and nutrition discussed Patient just started Mounjaro 5 mg weekly 1 week ago Recommend to also start metformin 500 mg twice a day, glipizide 5 mg twice a day, and Jardiance 10 mg daily Hypoglycemia precautions given Advised to stay well-hydrated and follow-up in 2 months

## 2023-04-10 NOTE — Progress Notes (Signed)
Nathan Wilkerson 32 y.o.   Chief Complaint  Patient presents with   Follow-up    Patient seen in ED for elevated glucose. Patient unable to check blood glucose at home.   Patient states he is having whole body muscle spasm.     HISTORY OF PRESENT ILLNESS: This is a 32 y.o. male seen by me on 03/26/2023 for annual exam and found to have uncontrolled diabetes with hemoglobin A1c over 13 Had to go to emergency department on 04/07/2023 due to nausea and vomiting.  No DKA.  Was released after IV fluids and antiemetics. Just started [facility] last Thursday 5 mg weekly.  Did not start other medications prescribed. Still having whole body muscle spasms every now and then No other complaints or medical concerns today  HPI   Prior to Admission medications   Medication Sig Start Date End Date Taking? Authorizing Provider  amitriptyline (ELAVIL) 25 MG tablet Take 25 mg by mouth at bedtime.   Yes [provider]  ARIPiprazole (ABILIFY) 20 MG tablet Take 20 mg by mouth daily.   Yes [provider]  tirzepatide Greggory Keen) 5 MG/0.5ML Pen Inject 5 mg into the skin once a week. 03/26/23  Yes Phylliss Strege, Eilleen Kempf, MD  Blood Glucose Monitoring Suppl DEVI 1 each by Does not apply route in the morning, at noon, and at bedtime. May substitute to any manufacturer covered by patient's insurance. 04/07/23   Rexford Maus, DO  empagliflozin (JARDIANCE) 10 MG TABS tablet Take 1 tablet (10 mg total) by mouth daily before breakfast. Patient not taking: Reported on 04/10/2023 03/28/23   Georgina Quint, MD  glipiZIDE (GLUCOTROL) 5 MG tablet Take 1 tablet (5 mg total) by mouth daily before breakfast. Patient not taking: Reported on 04/10/2023 03/28/23   Georgina Quint, MD  Glucose Blood (BLOOD GLUCOSE TEST STRIPS) STRP 1 each by In Vitro route in the morning, at noon, and at bedtime. May substitute to any manufacturer covered by patient's insurance. 04/07/23 05/10/23  Rexford Maus, DO   Lancet Device MISC 1 each by Does not apply route in the morning, at noon, and at bedtime. May substitute to any manufacturer covered by patient's insurance. 04/07/23 05/07/23  Rexford Maus, DO  Lancets Misc. MISC 1 each by Does not apply route in the morning, at noon, and at bedtime. May substitute to any manufacturer covered by patient's insurance. 04/07/23 05/07/23  Rexford Maus, DO  levocetirizine (XYZAL) 5 MG tablet Take 1 tablet (5 mg total) by mouth every evening. Patient not taking: Reported on 03/17/2023 01/10/23   Elenore Paddy, NP  metFORMIN (GLUCOPHAGE) 500 MG tablet Take 1 tablet (500 mg total) by mouth 2 (two) times daily with a meal. Patient not taking: Reported on 04/10/2023 07/15/22   Georgina Quint, MD  ondansetron (ZOFRAN) 4 MG tablet Take 1 tablet (4 mg total) by mouth every 6 (six) hours. Patient not taking: Reported on 04/10/2023 04/07/23   Rexford Maus, DO  OVER THE COUNTER MEDICATION     [provider]  rosuvastatin (CRESTOR) 20 MG tablet Take 1 tablet (20 mg total) by mouth daily. 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  Social History   Socioeconomic History   Marital status:  Single    Spouse name: Not on file   Number of children: Not on file   Years of education: Not on file   Highest education level: 12th grade  Occupational History   Not on file  Tobacco Use   Smoking status: Unknown   Smokeless tobacco: Never  Substance and Sexual Activity   Alcohol use: Not Currently   Drug use: Not Currently   Sexual activity: Not Currently  Other Topics Concern   Not on file  Social History Narrative   Not on file   Social Determinants of Health   Financial Resource Strain: Medium Risk (01/31/2023)   Overall Financial Resource Strain (CARDIA)    Difficulty of Paying Living Expenses: Somewhat hard  Food Insecurity: Food Insecurity Present (01/31/2023)   Hunger Vital Sign    Worried About Running Out of Food in the Last Year: Never true    Ran Out of Food in the Last Year: Sometimes true  Transportation Needs: No Transportation Needs (01/31/2023)   PRAPARE - Administrator, Civil Service (Medical): No    Lack of Transportation (Non-Medical): No  Physical Activity: Unknown (01/31/2023)   Exercise Vital Sign    Days of Exercise per Week: 0 days    Minutes of Exercise per Session: Not on file  Stress: No Stress Concern Present (01/31/2023)   [employer] of Occupational Health - Occupational Stress Questionnaire    Feeling of Stress : Not at all  Social Connections: Socially Isolated (01/31/2023)   Social Connection and Isolation Panel [NHANES]    Frequency of Communication with Friends and Family: More than three times a week    Frequency of Social Gatherings with Friends and Family: More than three times a week    Attends Religious Services: Never    Database administrator or Organizations: No    Attends Engineer, structural: Not on file    Marital Status: Never married  Intimate Partner Violence: Unknown (12/28/2021)   Received from [facility]   HITS    Physically Hurt: Not on file    Insult or Talk Down To: Not on file     Threaten Physical Harm: Not on file    Scream or Curse: Not on file    Family History  Problem Relation Age of Onset   Diabetes Father    Diabetes Maternal Grandmother    Diabetes Paternal Grandmother      Review of Systems  Constitutional: Negative.  Negative for chills and fever.  HENT: Negative.  Negative for congestion and sore throat.   Respiratory: Negative.  Negative for cough and shortness of breath.   Cardiovascular: Negative.  Negative for chest pain and palpitations.  Gastrointestinal:  Negative for nausea and vomiting.  Skin: Negative.  Negative for rash.  Neurological: Negative.  Negative for dizziness and headaches.  All other systems reviewed and are negative.   Vitals:  04/10/23 1501  BP: (!) 140/86  Pulse: 86  Temp: 98.4 F (36.9 C)  SpO2: 98%    Physical Exam Vitals reviewed.  Constitutional:      Appearance: Normal appearance. He is obese.  HENT:     Head: Normocephalic.     Mouth/Throat:     Mouth: Mucous membranes are moist.     Pharynx: Oropharynx is clear.  Eyes:     Extraocular Movements: Extraocular movements intact.     Pupils: Pupils are equal, round, and reactive to light.  Cardiovascular:     Rate and Rhythm: Normal rate and regular rhythm.     Pulses: Normal pulses.     Heart sounds: Normal heart sounds.  Pulmonary:     Effort: Pulmonary effort is normal.     Breath sounds: Normal breath sounds.  Musculoskeletal:     Cervical back: No tenderness.  Lymphadenopathy:     Cervical: No cervical adenopathy.  Skin:    General: Skin is warm and dry.  Neurological:     Mental Status: He is alert and oriented to person, place, and time.  Psychiatric:        Mood and Affect: Mood normal.        Behavior: Behavior normal.    Results for orders placed or performed in visit on 04/10/23 (from the past 24 hour(s))  POCT Glucose (CBG)     Status: Abnormal   Collection Time: 04/10/23  3:36 PM  Result Value Ref Range   POC Glucose 282  (A) 70 - 99 mg/dl   Lab Results  Component Value Date   HGBA1C 13.2 (H) 03/26/2023     ASSESSMENT & PLAN: A total of 46 minutes was spent with the patient and counseling/coordination of care regarding preparing for this visit, review of most recent office visit notes, review of most recent emergency department visit notes, review of most recent blood work results, review of all medications and changes made, cardiovascular risks associated with uncontrolled diabetes, education on nutrition, prognosis, ED precautions, documentation and need for follow-up.  Problem List Items Addressed This Visit       Endocrine   Uncontrolled type 2 diabetes mellitus with hyperglycemia (HCC) - Primary    Cardiovascular risks associated with uncontrolled diabetes discussed Diet and nutrition discussed Patient just started Mounjaro 5 mg weekly 1 week ago Recommend to also start metformin 500 mg twice a day, glipizide 5 mg twice a day, and Jardiance 10 mg daily Hypoglycemia precautions given Advised to stay well-hydrated and follow-up in 2 months      Relevant Medications   Continuous Glucose Sensor (FREESTYLE LIBRE 3 SENSOR) MISC   empagliflozin (JARDIANCE) 10 MG TABS tablet   glipiZIDE (GLUCOTROL) 5 MG tablet   metFORMIN (GLUCOPHAGE) 500 MG tablet   rosuvastatin (CRESTOR) 20 MG tablet   Other Relevant Orders   POCT Glucose (CBG) (Completed)   Dyslipidemia associated with type 2 diabetes mellitus (HCC)    Diet and nutrition discussed Recommend to start rosuvastatin 20 mg daily.       Relevant Medications   empagliflozin (JARDIANCE) 10 MG TABS tablet   glipiZIDE (GLUCOTROL) 5 MG tablet   metFORMIN (GLUCOPHAGE) 500 MG tablet   rosuvastatin (CRESTOR) 20 MG tablet     Other   Morbid obesity (HCC)    Diet and nutrition discussed Benefits of exercise discussed Advised to decrease amount of daily carbohydrate intake and daily calories and increase amount of plant-based protein in his diet  Relevant Medications   empagliflozin (JARDIANCE) 10 MG TABS tablet   glipiZIDE (GLUCOTROL) 5 MG tablet   metFORMIN (GLUCOPHAGE) 500 MG tablet   Patient Instructions  Diabetes Mellitus and Nutrition, Adult When you have diabetes, or diabetes mellitus, it is very important to have healthy eating habits because your blood sugar (glucose) levels are greatly affected by what you eat and drink. Eating healthy foods in the right amounts, at about the same times every day, can help you: Manage your blood glucose. Lower your risk of heart disease. Improve your blood pressure. Reach or maintain a healthy weight. What can affect my meal plan? Every person with diabetes is different, and each person has different needs for a meal plan. Your health care provider may recommend that you work with a dietitian to make a meal plan that is best for you. Your meal plan may vary depending on factors such as: The calories you need. The medicines you take. Your weight. Your blood glucose, blood pressure, and cholesterol levels. Your activity level. Other health conditions you have, such as heart or kidney disease. How do carbohydrates affect me? Carbohydrates, also called carbs, affect your blood glucose level more than any other type of food. Eating carbs raises the amount of glucose in your blood. It is important to know how many carbs you can safely have in each meal. This is different for every person. Your dietitian can help you calculate how many carbs you should have at each meal and for each snack. How does alcohol affect me? Alcohol can cause a decrease in blood glucose (hypoglycemia), especially if you use insulin or take certain diabetes medicines by mouth. Hypoglycemia can be a life-threatening condition. Symptoms of hypoglycemia, such as sleepiness, dizziness, and confusion, are similar to symptoms of having too much alcohol. Do not drink alcohol if: Your health care provider tells you not to  drink. You are pregnant, may be pregnant, or are planning to become pregnant. If you drink alcohol: Limit how much you have to: 0-1 drink a day for women. 0-2 drinks a day for men. Know how much alcohol is in your drink. In the [place] one drink equals one 12 oz bottle of beer (355 mL), one 5 oz glass of wine (148 mL), or one 1 oz glass of hard liquor (44 mL). Keep yourself hydrated with water, diet soda, or unsweetened iced tea. Keep in mind that regular soda, juice, and other mixers may contain a lot of sugar and must be counted as carbs. What are tips for following this plan?  Reading food labels Start by checking the serving size on the Nutrition Facts label of packaged foods and drinks. The number of calories and the amount of carbs, fats, and other nutrients listed on the label are based on one serving of the item. Many items contain more than one serving per package. Check the total grams (g) of carbs in one serving. Check the number of grams of saturated fats and trans fats in one serving. Choose foods that have a low amount or none of these fats. Check the number of milligrams (mg) of salt (sodium) in one serving. Most people should limit total sodium intake to less than 2,300 mg per day. Always check the nutrition information of foods labeled as "low-fat" or "nonfat." These foods may be higher in added sugar or refined carbs and should be avoided. Talk to your dietitian to identify your daily goals for nutrients listed on the label. Shopping Avoid buying canned,  pre-made, or processed foods. These foods tend to be high in fat, sodium, and added sugar. Shop around the outside edge of the grocery store. This is where you will most often find fresh fruits and vegetables, bulk grains, fresh meats, and fresh dairy products. Cooking Use low-heat cooking methods, such as baking, instead of high-heat cooking methods, such as deep frying. Cook using healthy oils, such as olive, canola, or  sunflower oil. Avoid cooking with butter, cream, or high-fat meats. Meal planning Eat meals and snacks regularly, preferably at the same times every day. Avoid going long periods of time without eating. Eat foods that are high in fiber, such as fresh fruits, vegetables, beans, and whole grains. Eat 4-6 oz (112-168 g) of lean protein each day, such as lean meat, chicken, fish, eggs, or tofu. One ounce (oz) (28 g) of lean protein is equal to: 1 oz (28 g) of meat, chicken, or fish. 1 egg.  cup (62 g) of tofu. Eat some foods each day that contain healthy fats, such as avocado, nuts, seeds, and fish. What foods should I eat? Fruits Berries. Apples. Oranges. Peaches. Apricots. Plums. Grapes. Mangoes. Papayas. Pomegranates. Kiwi. Cherries. Vegetables Leafy greens, including lettuce, spinach, kale, chard, collard greens, mustard greens, and cabbage. Beets. Cauliflower. Broccoli. Carrots. Green beans. Tomatoes. Peppers. Onions. Cucumbers. Brussels sprouts. Grains Whole grains, such as whole-wheat or whole-grain bread, crackers, tortillas, cereal, and pasta. Unsweetened oatmeal. Quinoa. Brown or wild rice. Meats and other proteins Seafood. Poultry without skin. Lean cuts of poultry and beef. Tofu. Nuts. Seeds. Dairy Low-fat or fat-free dairy products such as milk, yogurt, and cheese. The items listed above may not be a complete list of foods and beverages you can eat and drink. Contact a dietitian for more information. What foods should I avoid? Fruits Fruits canned with syrup. Vegetables Canned vegetables. Frozen vegetables with butter or cream sauce. Grains Refined white flour and flour products such as bread, pasta, snack foods, and cereals. Avoid all processed foods. Meats and other proteins Fatty cuts of meat. Poultry with skin. Breaded or fried meats. Processed meat. Avoid saturated fats. Dairy Full-fat yogurt, cheese, or milk. Beverages Sweetened drinks, such as soda or iced tea. The  items listed above may not be a complete list of foods and beverages you should avoid. Contact a dietitian for more information. Questions to ask a health care provider Do I need to meet with a certified diabetes care and education specialist? Do I need to meet with a dietitian? What number can I call if I have questions? When are the best times to check my blood glucose? Where to find more information: American Diabetes Association: diabetes.org Academy of Nutrition and Dietetics: eatright.[employer] of Diabetes and Digestive and Kidney Diseases: StageSync.si Association of Diabetes Care & Education Specialists: diabeteseducator.org Summary It is important to have healthy eating habits because your blood sugar (glucose) levels are greatly affected by what you eat and drink. It is important to use alcohol carefully. A healthy meal plan will help you manage your blood glucose and lower your risk of heart disease. Your health care provider may recommend that you work with a dietitian to make a meal plan that is best for you. This information is not intended to replace advice given to you by your health care provider. Make sure you discuss any questions you have with your health care provider. Document Revised: 04/12/2020 Document Reviewed: 04/12/2020 Elsevier Patient Education  2024 [employer].      [place],  MD [facility]

## 2023-04-10 NOTE — Assessment & Plan Note (Signed)
Diet and nutrition discussed. Benefits of exercise discussed. Advised to decrease amount of daily carbohydrate intake and daily calories and increase amount of plant-based protein in his diet 

## 2023-04-10 NOTE — Patient Instructions (Signed)

## 2023-04-10 NOTE — Assessment & Plan Note (Signed)
Diet and nutrition discussed Recommend to start rosuvastatin 20 mg daily.

## 2023-04-16 ENCOUNTER — Ambulatory Visit: Payer: Managed Care, Other (non HMO) | Admitting: Emergency Medicine

## 2023-04-28 ENCOUNTER — Encounter: Payer: Self-pay | Admitting: Family Medicine

## 2023-04-28 ENCOUNTER — Ambulatory Visit (INDEPENDENT_AMBULATORY_CARE_PROVIDER_SITE_OTHER): Payer: Managed Care, Other (non HMO) | Admitting: Family Medicine

## 2023-04-28 VITALS — BP 112/78 | HR 82 | Temp 98.2°F | Resp 20 | Ht 68.0 in | Wt 273.0 lb

## 2023-04-28 DIAGNOSIS — R200 Anesthesia of skin: Secondary | ICD-10-CM | POA: Diagnosis not present

## 2023-04-28 LAB — VITAMIN B12: Vitamin B-12: 402 pg/mL (ref 211–911)

## 2023-04-28 LAB — TSH: TSH: 2.31 u[IU]/mL (ref 0.35–5.50)

## 2023-04-28 LAB — MAGNESIUM: Magnesium: 1.9 mg/dL (ref 1.5–2.5)

## 2023-04-28 MED ORDER — GABAPENTIN 100 MG PO CAPS
100.0000 mg | ORAL_CAPSULE | Freq: Every day | ORAL | 0 refills | Status: AC
Start: 2023-04-28 — End: ?

## 2023-04-28 NOTE — Addendum Note (Signed)
Addended by: Gwenlyn Fudge on: 04/28/2023 05:21 PM   Modules accepted: Orders

## 2023-04-28 NOTE — Progress Notes (Signed)
Assessment & Plan:  1. Numbness in right leg Discussed this is likely due to his uncontrolled diabetes.  Labs to rule out other causes.  If these labs are negative I will send him and gabapentin 100 mg to trial.  Education provided on peripheral neuropathy. - Vitamin B12 - TSH - Magnesium   Follow up plan: Return if symptoms worsen or fail to improve.  [name], MSN, APRN, FNP-C  Subjective:  HPI: Nathan Wilkerson is a 32 y.o. male presenting on 04/28/2023 for Numbness (In the right upper (top) of thigh x several months )  Patient reports numbness on the top of his right thigh that has been present for several months.  The numbness sensation has been there all the time.  He reports that the area is getting larger.  He does have occasional back pain but does not feel it is associated with the numbness in his thigh.  He does spend long periods of time on his feet at work.   ROS: Negative unless specifically indicated above in HPI.   Relevant past medical history reviewed and updated as indicated.   Allergies and medications reviewed and updated.   Current Outpatient Medications:    amitriptyline (ELAVIL) 25 MG tablet, Take 25 mg by mouth at bedtime., Disp: , Rfl:    ARIPiprazole (ABILIFY) 20 MG tablet, Take 20 mg by mouth daily., Disp: , Rfl:    Continuous Glucose Sensor (FREESTYLE LIBRE 3 SENSOR) MISC, Place one sensor on skin every 14 days. Use as directed., Disp: 1 each, Rfl: 3   empagliflozin (JARDIANCE) 10 MG TABS tablet, Take 1 tablet (10 mg total) by mouth daily before breakfast., Disp: 30 tablet, Rfl: 5   metFORMIN (GLUCOPHAGE) 500 MG tablet, Take 1 tablet (500 mg total) by mouth 2 (two) times daily with a meal., Disp: 180 tablet, Rfl: 3   OVER THE COUNTER MEDICATION, , Disp: , Rfl:    rosuvastatin (CRESTOR) 20 MG tablet, Take 1 tablet (20 mg total) by mouth daily., Disp: 90 tablet, Rfl: 3   tirzepatide (MOUNJARO) 5 MG/0.5ML Pen, Inject 5 mg into the skin once a week.,  Disp: 6 mL, Rfl: 3   Blood Glucose Monitoring Suppl DEVI, 1 each by Does not apply route in the morning, at noon, and at bedtime. May substitute to any manufacturer covered by patient's insurance. (Patient not taking: Reported on 04/28/2023), Disp: 1 each, Rfl: 0   Glucose Blood (BLOOD GLUCOSE TEST STRIPS) STRP, 1 each by In Vitro route in the morning, at noon, and at bedtime. May substitute to any manufacturer covered by patient's insurance. (Patient not taking: Reported on 04/28/2023), Disp: 100 strip, Rfl: 0   Lancet Device MISC, 1 each by Does not apply route in the morning, at noon, and at bedtime. May substitute to any manufacturer covered by patient's insurance. (Patient not taking: Reported on 04/28/2023), Disp: 1 each, Rfl: 0  Allergies  Allergen Reactions   Amoxicillin     Objective:   BP 112/78   Pulse 82   Temp 98.2 F (36.8 C)   Resp 20   Ht 5\' 8"  (1.727 m)   Wt 273 lb (123.8 kg)   BMI 41.51 kg/m    Physical Exam Vitals reviewed.  Constitutional:      General: He is not in acute distress.    Appearance: Normal appearance. He is not ill-appearing, toxic-appearing or diaphoretic.  HENT:     Head: Normocephalic and atraumatic.  Eyes:     General: No  scleral icterus.       Right eye: No discharge.        Left eye: No discharge.     Conjunctiva/sclera: Conjunctivae normal.  Cardiovascular:     Rate and Rhythm: Normal rate.  Pulmonary:     Effort: Pulmonary effort is normal. No respiratory distress.  Musculoskeletal:        General: Normal range of motion.     Cervical back: Normal range of motion.     Lumbar back: Normal.  Skin:    General: Skin is warm and dry.  Neurological:     Mental Status: He is alert and oriented to person, place, and time. Mental status is at baseline.  Psychiatric:        Mood and Affect: Mood normal.        Behavior: Behavior normal.        Thought Content: Thought content normal.        Judgment: Judgment normal.

## 2023-06-25 ENCOUNTER — Telehealth: Payer: Self-pay | Admitting: Emergency Medicine

## 2023-06-25 ENCOUNTER — Other Ambulatory Visit: Payer: Self-pay | Admitting: *Deleted

## 2023-06-25 MED ORDER — DEXCOM G7 SENSOR MISC: 5 refills | Status: DC

## 2023-06-25 NOTE — Telephone Encounter (Signed)
Patient needs dexcom sent in instead of free style libre 3 - [facility] in [place] [address].

## 2023-06-25 NOTE — Telephone Encounter (Signed)
New proscription sent to patient requested pharmacy.

## 2023-06-26 ENCOUNTER — Other Ambulatory Visit (HOSPITAL_COMMUNITY): Payer: Self-pay

## 2023-07-07 ENCOUNTER — Encounter: Payer: Self-pay | Admitting: Emergency Medicine

## 2023-07-07 ENCOUNTER — Telehealth: Payer: Self-pay

## 2023-07-07 MED ORDER — FREESTYLE LIBRE 3 SENSOR MISC: 5 refills | Status: DC

## 2023-07-07 NOTE — Telephone Encounter (Signed)
Pharmacy Patient Advocate Encounter   Received notification from Patient Advice Request messages that prior authorization for FreeStyle Libre 3 Sensor is required/requested.   Insurance verification completed.   The patient is insured through [employer] .   Per test claim: PA required; PA submitted to [employer] via CoverMyMeds Key/confirmation #/[id number] Status is pending

## 2023-07-07 NOTE — Telephone Encounter (Signed)
PA needed for [facility] sensors

## 2023-07-08 ENCOUNTER — Encounter: Payer: Self-pay | Admitting: *Deleted

## 2023-07-08 NOTE — Telephone Encounter (Signed)
Pharmacy Patient Advocate Encounter  Received notification from [employer] that Prior Authorization for FreeStyle Libre 3 Sensor has been DENIED.  Full denial letter will be uploaded to the media tab. See denial reason below.   PA #/Case ID/Reference #: [id number]   DENIAL REASON: There is no indication your patient is on any of the following insulin regimens: 1. multiple daily injections; 2. long-acting basal insulin (for example, glargine, detemir, degludec, NPH); or 3. continuous subcutaneous external insulin pump.  Freestyle Libre 3 sensors and readers are considered medically necessary for the management of type 1 or type 2 diabetes mellitus when the patient is age 31 years or older and on any of the following insulin regimens: 1. multiple daily injections; 2. long-acting basal insulin (for example, glargine, detemir, degludec, NPH); or 3. continuous subcutaneous external insulin pump.

## 2023-07-09 MED ORDER — DEXCOM G7 SENSOR MISC: 5 refills | Status: AC

## 2023-07-09 NOTE — Telephone Encounter (Signed)
PT needs PA for Dexcom G7

## 2023-07-10 ENCOUNTER — Telehealth: Payer: Self-pay | Admitting: Pharmacy Technician

## 2023-07-10 ENCOUNTER — Other Ambulatory Visit (HOSPITAL_COMMUNITY): Payer: Self-pay

## 2023-07-10 NOTE — Telephone Encounter (Signed)
Pharmacy Patient Advocate Encounter  Received notification from [employer] that Prior Authorization for Dexcom G7 Sensor has been CANCELLED due to Previously CGM requirements were not met due to patient not on insulin.

## 2023-07-16 ENCOUNTER — Encounter: Payer: Managed Care, Other (non HMO) | Admitting: Neurology

## 2023-07-16 NOTE — Telephone Encounter (Signed)
Okay for requested letter.

## 2023-07-31 ENCOUNTER — Other Ambulatory Visit (HOSPITAL_COMMUNITY): Payer: Self-pay

## 2023-09-01 ENCOUNTER — Other Ambulatory Visit: Payer: Self-pay | Admitting: Radiology

## 2023-09-01 DIAGNOSIS — E1169 Type 2 diabetes mellitus with other specified complication: Secondary | ICD-10-CM

## 2023-09-01 MED ORDER — EMPAGLIFLOZIN 10 MG PO TABS
10.0000 mg | ORAL_TABLET | Freq: Every day | ORAL | 5 refills | Status: AC
Start: 2023-09-01 — End: ?

## 2023-10-01 ENCOUNTER — Ambulatory Visit: Payer: Managed Care, Other (non HMO) | Admitting: Emergency Medicine

## 2023-10-02 ENCOUNTER — Telehealth: Payer: Self-pay

## 2023-10-02 NOTE — Progress Notes (Signed)
 Care Guide Pharmacy Note  10/02/2023 Name: Nathan Wilkerson MRN: [id number] DOB: [birth year]  Referred By: Purcell Emil Schanz, MD Reason for referral: Care Coordination ([facility] Diabetes. )   Nathan Wilkerson is a 33 y.o. year old male who is a primary care patient of Sagardia, Emil Schanz, MD.  Nathan Wilkerson was referred to the pharmacist for assistance related to: DMII  An unsuccessful telephone outreach was attempted today to contact the patient who was referred to the pharmacy team for assistance with diabetes. Additional attempts will be made to contact the patient.  Dreama [facility] Assistant [phone]

## 2023-10-06 ENCOUNTER — Telehealth: Payer: Self-pay

## 2023-10-06 NOTE — Progress Notes (Addendum)
 Care Guide Pharmacy Note  10/06/2023 Name: Freedom Peddy MRN: [id number] DOB: [birth year]  Referred By: Purcell Emil Schanz, MD Reason for referral: Care Coordination ([facility] Diabetes. )   Keiden Aldridge is a 33 y.o. year old male who is a primary care patient of Sagardia, Emil Schanz, MD.  Moyses Mccullars was referred to the pharmacist for assistance related to: DMII  Successful contact was made with the patient to discuss pharmacy services.  Patient declines engagement at this time. Contact information was provided to the patient should they wish to reach out for assistance at a later time.  Dreama Lynwood [facility] Assistant [phone]

## 2023-10-13 ENCOUNTER — Ambulatory Visit: Payer: Managed Care, Other (non HMO) | Admitting: Emergency Medicine

## 2023-11-14 ENCOUNTER — Other Ambulatory Visit: Payer: Self-pay | Admitting: Emergency Medicine

## 2023-11-14 DIAGNOSIS — R7303 Prediabetes: Secondary | ICD-10-CM

## 2023-11-14 NOTE — Telephone Encounter (Signed)
Copied from CRM [id number]. Topic: Clinical - Medication Refill >> Nov 14, 2023  2:34 PM Almira Coaster wrote: Most Recent Primary Care Visit:  Provider: Gwenlyn Fudge  Department: [facility]  Visit Type: OFFICE VISIT  Date: 04/28/2023  Medication: tirzepatide (MOUNJARO) 5 MG/0.5ML Pen & metFORMIN (GLUCOPHAGE) 500 MG tablet  Has the patient contacted their pharmacy? No, [place] of [place] and is requesting a refill be sent to his new pharmacy. (Agent: If no, request that the patient contact the pharmacy for the refill. If patient does not wish to contact the pharmacy document the reason why and proceed with request.) (Agent: If yes, when and what did the pharmacy advise?)  Is this the correct pharmacy for this prescription? Yes If no, delete pharmacy and type the correct one.  This is the patient's preferred pharmacy:  [facility] [address] [place] - [address] [place] [address] Phone: [phone] Fax: [phone]    Has the prescription been filled recently? No  Is the patient out of the medication? No  Has the patient been seen for an appointment in the last year OR does the patient have an upcoming appointment? Yes, last appointment was [facility] 2024  Can we respond through MyChart? Yes  Agent: Please be advised that Rx refills may take up to 3 business days. We ask that you follow-up with your pharmacy.

## 2023-11-20 MED ORDER — METFORMIN HCL 500 MG PO TABS
500.0000 mg | ORAL_TABLET | Freq: Two times a day (BID) | ORAL | 3 refills | Status: AC
Start: 2023-11-20 — End: ?

## 2023-11-20 MED ORDER — TIRZEPATIDE 5 MG/0.5ML ~~LOC~~ SOAJ: 5.0000 mg | SUBCUTANEOUS | 3 refills | Status: AC

## 2023-12-04 ENCOUNTER — Telehealth: Payer: Self-pay

## 2023-12-04 NOTE — Telephone Encounter (Signed)
 Patient was identified as falling into the [place] Measure - Diabetes.   Patient was: Left voicemail to schedule with primary care provider.

## 2024-04-12 ENCOUNTER — Other Ambulatory Visit (HOSPITAL_COMMUNITY): Payer: Self-pay

## 2024-09-09 ENCOUNTER — Telehealth: Payer: Self-pay | Admitting: Emergency Medicine

## 2024-09-09 NOTE — Telephone Encounter (Signed)
 Copied from CRM [id number]. Topic: General - Other >> Sep 09, 2024  9:58 AM China J wrote: Reason for CRM: The patient is now in [place]  and his unemployment is requiring a letter from his doctor that states that he was diagnosed with sleep apnea and type 2 diabetes.  Please call [phone] for an update.

## 2024-09-09 NOTE — Telephone Encounter (Signed)
 Looking through his chart it does not look like he has

## 2024-09-09 NOTE — Telephone Encounter (Signed)
 I see suspected sleep apena on there problem list can this be used

## 2024-09-09 NOTE — Telephone Encounter (Signed)
 Yes it can but was he ever tested for sleep apnea

## 2024-09-09 NOTE — Telephone Encounter (Signed)
 He needs to be tested before we can say he has this condition.

## 2024-09-10 ENCOUNTER — Encounter: Payer: Self-pay | Admitting: Emergency Medicine

## 2024-09-10 NOTE — Telephone Encounter (Signed)
 Letter has been written.

## 2024-09-10 NOTE — Telephone Encounter (Signed)
 Okay to write letter with information we have available on his record.  Thanks.

## 2024-09-10 NOTE — Telephone Encounter (Signed)
 Sleep study results ( encounter from neurology) 04/16/2023

## 2024-09-10 NOTE — Telephone Encounter (Signed)
 Wrong date 04/01/2023

## 2024-09-24 ENCOUNTER — Encounter: Payer: Self-pay | Admitting: Emergency Medicine
